# Patient Record
Sex: Female | Born: 1950 | Race: White | Hispanic: No | Marital: Married | Smoking: Never smoker
Health system: Southern US, Community
[De-identification: ages and names within clinical notes are randomized; demographics above are authoritative.]

## PROBLEM LIST (undated history)

## (undated) DIAGNOSIS — E785 Hyperlipidemia, unspecified: Secondary | ICD-10-CM

## (undated) DIAGNOSIS — M810 Age-related osteoporosis without current pathological fracture: Secondary | ICD-10-CM

## (undated) DIAGNOSIS — M1990 Unspecified osteoarthritis, unspecified site: Secondary | ICD-10-CM

## (undated) DIAGNOSIS — I10 Essential (primary) hypertension: Secondary | ICD-10-CM

## (undated) DIAGNOSIS — D649 Anemia, unspecified: Secondary | ICD-10-CM

## (undated) DIAGNOSIS — R002 Palpitations: Secondary | ICD-10-CM

## (undated) DIAGNOSIS — K8020 Calculus of gallbladder without cholecystitis without obstruction: Secondary | ICD-10-CM

## (undated) DIAGNOSIS — K219 Gastro-esophageal reflux disease without esophagitis: Secondary | ICD-10-CM

## (undated) DIAGNOSIS — Z5189 Encounter for other specified aftercare: Secondary | ICD-10-CM

## (undated) DIAGNOSIS — E079 Disorder of thyroid, unspecified: Secondary | ICD-10-CM

## (undated) DIAGNOSIS — H269 Unspecified cataract: Secondary | ICD-10-CM

## (undated) DIAGNOSIS — D689 Coagulation defect, unspecified: Secondary | ICD-10-CM

## (undated) HISTORY — DX: Unspecified cataract: H26.9

## (undated) HISTORY — DX: Calculus of gallbladder without cholecystitis without obstruction: K80.20

## (undated) HISTORY — DX: Coagulation defect, unspecified: D68.9

## (undated) HISTORY — PX: CHOLECYSTECTOMY: SHX55

## (undated) HISTORY — PX: TONSILLECTOMY: SUR1361

## (undated) HISTORY — DX: Palpitations: R00.2

## (undated) HISTORY — DX: Encounter for other specified aftercare: Z51.89

## (undated) HISTORY — PX: EAR PINNA RECONSTRUCTION W/ RIB GRAFT: SHX1484

## (undated) HISTORY — DX: Essential (primary) hypertension: I10

## (undated) HISTORY — DX: Disorder of thyroid, unspecified: E07.9

## (undated) HISTORY — DX: Hyperlipidemia, unspecified: E78.5

## (undated) HISTORY — DX: Age-related osteoporosis without current pathological fracture: M81.0

## (undated) HISTORY — DX: Unspecified osteoarthritis, unspecified site: M19.90

## (undated) HISTORY — DX: Gastro-esophageal reflux disease without esophagitis: K21.9

## (undated) HISTORY — DX: Anemia, unspecified: D64.9

---

## 2020-09-26 ENCOUNTER — Ambulatory Visit: Payer: Self-pay | Admitting: Nurse Practitioner

## 2021-01-18 ENCOUNTER — Other Ambulatory Visit: Payer: Self-pay

## 2021-01-18 ENCOUNTER — Other Ambulatory Visit: Payer: Self-pay | Admitting: Nurse Practitioner

## 2021-01-18 ENCOUNTER — Encounter: Payer: Self-pay | Admitting: Nurse Practitioner

## 2021-01-18 ENCOUNTER — Ambulatory Visit: Payer: Self-pay | Attending: Nurse Practitioner | Admitting: Nurse Practitioner

## 2021-01-18 VITALS — BP 116/75 | HR 73 | Resp 16 | Ht 62.0 in | Wt 157.4 lb

## 2021-01-18 DIAGNOSIS — Z13228 Encounter for screening for other metabolic disorders: Secondary | ICD-10-CM

## 2021-01-18 DIAGNOSIS — Z131 Encounter for screening for diabetes mellitus: Secondary | ICD-10-CM

## 2021-01-18 DIAGNOSIS — Z130 Encounter for screening for diseases of the blood and blood-forming organs and certain disorders involving the immune mechanism: Secondary | ICD-10-CM

## 2021-01-18 DIAGNOSIS — Z1329 Encounter for screening for other suspected endocrine disorder: Secondary | ICD-10-CM

## 2021-01-18 DIAGNOSIS — Z1211 Encounter for screening for malignant neoplasm of colon: Secondary | ICD-10-CM

## 2021-01-18 DIAGNOSIS — Z1159 Encounter for screening for other viral diseases: Secondary | ICD-10-CM

## 2021-01-18 DIAGNOSIS — I10 Essential (primary) hypertension: Secondary | ICD-10-CM

## 2021-01-18 DIAGNOSIS — Z2821 Immunization not carried out because of patient refusal: Secondary | ICD-10-CM

## 2021-01-18 DIAGNOSIS — E785 Hyperlipidemia, unspecified: Secondary | ICD-10-CM

## 2021-01-18 DIAGNOSIS — Z7689 Persons encountering health services in other specified circumstances: Secondary | ICD-10-CM

## 2021-01-18 MED ORDER — ENALAPRIL MALEATE 5 MG PO TABS: 5.0000 mg | ORAL_TABLET | Freq: Every day | ORAL | 1 refills | Status: DC

## 2021-01-18 NOTE — Progress Notes (Signed)
Assessment & Plan:  Annette Weiss was seen today for new patient (initial visit) and hypertension.  Diagnoses and all orders for this visit:  Encounter to establish care  Essential hypertension -     enalapril (VASOTEC) 5 MG tablet; Take 1 tablet (5 mg total) by mouth daily. Continue all antihypertensives as prescribed.  Remember to bring in your blood pressure log with you for your follow up appointment.  DASH/Mediterranean Diets are healthier choices for HTN.   Dyslipidemia, goal LDL below 100 -     Lipid panel INSTRUCTIONS: Work on a low fat, heart healthy diet and participate in regular aerobic exercise program by working out at least 150 minutes per week; 5 days a week-30 minutes per day. Avoid red meat/beef/steak,  fried foods. junk foods, sodas, sugary drinks, unhealthy snacking, alcohol and smoking.  Drink at least 80 oz of water per day and monitor your carbohydrate intake daily.   Influenza vaccine refused  COVID-19 vaccine dose declined  Tetanus, diphtheria, and acellular pertussis (Tdap) vaccination declined  Screening for deficiency anemia -     CBC  Encounter for screening for diabetes mellitus -     Hemoglobin A1c  Screening for metabolic disorder -     [id number]  Thyroid disorder screening -     Thyroid Panel With TSH  Need for hepatitis C screening test -     HCV Ab w Reflex to Quant PCR  Colon cancer screening -     Fecal occult blood, imunochemical    Patient has been counseled on age-appropriate routine health concerns for screening and prevention. These are reviewed and up-to-date. Referrals have been placed accordingly. Immunizations are up-to-date or declined.    Subjective:   Chief Complaint  Patient presents with  . New Patient (Initial Visit)  . Hypertension   HPI Annette Weiss 70 y.o. female presents to office today caompanied by her daughter who is translating.  She is a green card holder and had previously been going back and forth from  [place] but will now be residing in the [place].  She endorses a history of hypertension, hyperlipidemia and thyroid disorder but cannot recall if it is hyper or hypothyroid. Patient has been counseled on age-appropriate routine health concerns for screening and prevention. These are reviewed and up-to-date. Referrals have been placed accordingly. Immunizations are up-to-date or declined.    Mammogram: Refer to the breast clinic for a mammogram scholarship program Colonoscopy: FOBT given today   ESSENTIAL HYPERTENSION Home blood pressure readings averaging 135-140/70s.  She was previously prescribed enalapril 10 mg however she has not been taking this and has been taking a natural supplement from her country of [place].  Blood pressure is well controlled today. Denies chest pain, shortness of breath, palpitations, lightheadedness, dizziness, headaches or BLE edema.  BP Readings from Last 3 Encounters:  01/18/21 116/75    THYROID DISORDER She states that she was diagnosed with a small thyroid nodule and her thyroid levels have been abnormal in the past however she is not taking any thyroid medication.  Dyslipidemia States she has been on a statin in the past however this caused cardiac symptoms of heart palpitations so she stopped.  I did recommend that she take omega-3 over-the-counter.   COVID She was diagnosed with COVID/pneumonia in January 22 and required an inpatient hospital stay.  Post Covid symptoms she is currently experiencing: Mild shortness of breath with increased activity.   Review of Systems  Constitutional: Negative for fever, malaise/fatigue and weight  loss.  HENT: Negative for nosebleeds and sore throat.   Eyes: Negative.  Negative for blurred vision, double vision and photophobia.  Respiratory: Negative.  Negative for cough and shortness of breath (with exertion).   Cardiovascular: Negative.  Negative for chest pain, palpitations and leg swelling.   Gastrointestinal: Negative.  Negative for heartburn, nausea and vomiting.  Musculoskeletal: Negative.  Negative for myalgias.  Neurological: Negative.  Negative for dizziness, focal weakness, seizures and headaches.  Psychiatric/Behavioral: Negative.  Negative for suicidal ideas.    Past Medical History:  Diagnosis Date  . Hyperlipidemia   . Hypertension   . Thyroid disease     History reviewed. No pertinent surgical history.  History reviewed. No pertinent family history.  Social History Reviewed with no changes to be made today.   Outpatient Medications Prior to Visit  Medication Sig Dispense Refill  . aspirin EC 81 MG tablet Take 81 mg by mouth daily. Swallow whole.     No facility-administered medications prior to visit.    No Known Allergies     Objective:    BP 116/75   Pulse 73   Resp 16   Ht '5\' 2"'  (1.575 m)   Wt 157 lb 6.4 oz (71.4 kg)   SpO2 95%   BMI 28.79 kg/m  Wt Readings from Last 3 Encounters:  01/18/21 157 lb 6.4 oz (71.4 kg)    Physical Exam Vitals and nursing note reviewed.  Constitutional:      Appearance: She is well-developed and well-nourished.  HENT:     Head: Normocephalic and atraumatic.  Eyes:     Extraocular Movements: EOM normal.  Cardiovascular:     Rate and Rhythm: Normal rate and regular rhythm.     Pulses: Intact distal pulses.     Heart sounds: Normal heart sounds. No murmur heard. No friction rub. No gallop.   Pulmonary:     Effort: Pulmonary effort is normal. No tachypnea or respiratory distress.     Breath sounds: Normal breath sounds. No decreased breath sounds, wheezing, rhonchi or rales.  Chest:     Chest wall: No tenderness.  Abdominal:     General: Bowel sounds are normal.     Palpations: Abdomen is soft.  Musculoskeletal:        General: No edema. Normal range of motion.     Cervical back: Normal range of motion.  Skin:    General: Skin is warm and dry.  Neurological:     Mental Status: She is alert and  oriented to person, place, and time.     Coordination: Coordination normal.  Psychiatric:        Mood and Affect: Mood and affect normal.        Behavior: Behavior normal. Behavior is cooperative.        Thought Content: Thought content normal.        Judgment: Judgment normal.          Patient has been counseled extensively about nutrition and exercise as well as the importance of adherence with medications and regular follow-up. The patient was given clear instructions to go to ER or return to medical center if symptoms don't improve, worsen or new problems develop. The patient verbalized understanding.   Follow-up: Return for Physical ONLY no labs.   Gildardo Pounds, FNP-BC [facility] [place], [place]   01/18/2021, 9:53 AM

## 2021-01-19 LAB — LIPID PANEL
Chol/HDL Ratio: 3.9 ratio (ref 0.0–4.4)
Cholesterol, Total: 270 mg/dL — ABNORMAL HIGH (ref 100–199)
HDL: 70 mg/dL (ref >39)
LDL Chol Calc (NIH): 181 mg/dL — ABNORMAL HIGH (ref 0–99)
Triglycerides: 108 mg/dL (ref 0–149)
VLDL Cholesterol Cal: 19 mg/dL (ref 5–40)

## 2021-01-19 LAB — HCV AB W REFLEX TO QUANT PCR: HCV Ab: <0.1 s/co ratio (ref 0.0–0.9)

## 2021-01-19 LAB — CBC
Hematocrit: 38.1 % (ref 34.0–46.6)
Hemoglobin: 12.4 g/dL (ref 11.1–15.9)
MCH: 29.2 pg (ref 26.6–33.0)
MCHC: 32.5 g/dL (ref 31.5–35.7)
MCV: 90 fL (ref 79–97)
Platelets: 189 10*3/uL (ref 150–450)
RBC: 4.25 x10E6/uL (ref 3.77–5.28)
RDW: 12.7 % (ref 11.7–15.4)
WBC: 5.8 10*3/uL (ref 3.4–10.8)

## 2021-01-19 LAB — CMP14+EGFR
ALT: 22 IU/L (ref 0–32)
AST: 18 IU/L (ref 0–40)
Albumin/Globulin Ratio: 1.3 (ref 1.2–2.2)
Albumin: 4.2 g/dL (ref 3.7–4.7)
Alkaline Phosphatase: 85 IU/L (ref 44–121)
BUN/Creatinine Ratio: 22 (ref 12–28)
BUN: 14 mg/dL (ref 8–27)
Bilirubin Total: 0.3 mg/dL (ref 0.0–1.2)
CO2: 20 mmol/L (ref 20–29)
Calcium: 9.5 mg/dL (ref 8.7–10.3)
Chloride: 106 mmol/L (ref 96–106)
Creatinine, Ser: 0.65 mg/dL (ref 0.57–1.00)
Globulin, Total: 3.3 g/dL (ref 1.5–4.5)
Glucose: 114 mg/dL — ABNORMAL HIGH (ref 65–99)
Potassium: 4.2 mmol/L (ref 3.5–5.2)
Sodium: 143 mmol/L (ref 134–144)
Total Protein: 7.5 g/dL (ref 6.0–8.5)
eGFR: 93 mL/min/{1.73_m2} (ref >59)

## 2021-01-19 LAB — THYROID PANEL WITH TSH
Free Thyroxine Index: 1.5 (ref 1.2–4.9)
T3 Uptake Ratio: 27 % (ref 24–39)
T4, Total: 5.7 ug/dL (ref 4.5–12.0)
TSH: 1.25 u[IU]/mL (ref 0.450–4.500)

## 2021-01-19 LAB — HEMOGLOBIN A1C
Est. average glucose Bld gHb Est-mCnc: 108 mg/dL
Hgb A1c MFr Bld: 5.4 % (ref 4.8–5.6)

## 2021-01-19 LAB — HCV INTERPRETATION

## 2021-01-21 LAB — FECAL OCCULT BLOOD, IMMUNOCHEMICAL: Fecal Occult Bld: NEGATIVE

## 2021-01-25 ENCOUNTER — Telehealth: Payer: Self-pay | Admitting: Nurse Practitioner

## 2021-01-25 NOTE — Telephone Encounter (Signed)
Copied from CRM [id number]. Topic: General - Other >> Jan 25, 2021 10:27 AM Wyonia Hough E wrote: Reason for CRM: Pt wanted information on low cost dentist / pt has tooth pain and no insurance please advise asap/ please advise

## 2021-01-25 NOTE — Telephone Encounter (Signed)
Pt will be mailed dental listing

## 2021-02-20 ENCOUNTER — Other Ambulatory Visit: Payer: Self-pay

## 2021-02-20 ENCOUNTER — Ambulatory Visit: Payer: Self-pay | Attending: Nurse Practitioner

## 2021-03-13 ENCOUNTER — Other Ambulatory Visit: Payer: Self-pay | Admitting: Nurse Practitioner

## 2021-03-13 ENCOUNTER — Telehealth: Payer: Self-pay | Admitting: Nurse Practitioner

## 2021-03-13 DIAGNOSIS — K0889 Other specified disorders of teeth and supporting structures: Secondary | ICD-10-CM

## 2021-03-13 NOTE — Telephone Encounter (Signed)
Referral placed.

## 2021-03-13 NOTE — Telephone Encounter (Signed)
Will forward to provider  

## 2021-03-13 NOTE — Telephone Encounter (Signed)
Patient requesting referral to dentist

## 2021-03-13 NOTE — Telephone Encounter (Signed)
Patient called to ask if she could get a referral to the dentist listed on her orange card ([facility]) because she has a bad pain on her right side bottom tooth.  She stated the dentist told her she would need a referral.  Please advise and cal patient to discuss at [phone]

## 2021-03-16 ENCOUNTER — Other Ambulatory Visit: Payer: Self-pay

## 2021-03-16 MED ORDER — CLINDAMYCIN HCL 300 MG PO CAPS
300.0000 mg | ORAL_CAPSULE | Freq: Four times a day (QID) | ORAL | 0 refills | Status: DC
  Filled 2021-03-16: qty 40, 10d supply, fill #0

## 2021-03-20 ENCOUNTER — Other Ambulatory Visit: Payer: Self-pay

## 2021-05-17 ENCOUNTER — Ambulatory Visit: Payer: Self-pay | Admitting: Physician Assistant

## 2021-05-30 ENCOUNTER — Other Ambulatory Visit: Payer: Self-pay

## 2021-07-05 ENCOUNTER — Ambulatory Visit: Payer: Self-pay

## 2021-07-05 NOTE — Telephone Encounter (Signed)
Pt. Reports she has felt bad x 3 weeks "and it's getting worse.Headache,dizziness and I feel weak." BP today at home is 169/98. Refuses UC/ED.The practice is currently closed for lunch. Pt. Would like an appointment as soon as possible. Please advise.    Reason for Disposition  [1] Taking BP medications AND [2] feels is having side effects (e.g., impotence, cough, dizzy upon standing)  Answer Assessment - Initial Assessment Questions 1. BLOOD PRESSURE: "What is the blood pressure?" "Did you take at least two measurements 5 minutes apart?"     169/98 2. ONSET: "When did you take your blood pressure?"     3 weeks 3. HOW: "How did you obtain the blood pressure?" (e.g., visiting nurse, automatic home BP monitor)     Home cuff 4. HISTORY: "Do you have a history of high blood pressure?"     No 5. MEDICATIONS: "Are you taking any medications for blood pressure?" "Have you missed any doses recently?"     No 6. OTHER SYMPTOMS: "Do you have any symptoms?" (e.g., headache, chest pain, blurred vision, difficulty breathing, weakness)     Dizziness, weak, headache 7. PREGNANCY: "Is there any chance you are pregnant?" "When was your last menstrual period?"     no  Protocols used: Blood Pressure - High-A-AH

## 2021-07-06 NOTE — Telephone Encounter (Signed)
Please schedule patient an appointment with a provider.

## 2021-07-17 NOTE — Telephone Encounter (Signed)
Patient's daughter is calling- BP is still high- today 186/95. Dizzy, weak and fatigued. [facility] and they told her they could not treat her- no doctor on staff. Patient is taking the prescribed medication - but not controlling the BP all the time. BP goes up at night. They are upset they have not heard back from the office- call to office today- only available appointment is 9/7, 3:10 which they were scheduled for. Patient's daughter is hesitant to go to ED- she does not know if they will accept the [facility] card- for patient care. Patient will not go to UC.  Advised UC/ED for BP and symptoms- unsure if they will go.

## 2021-07-18 ENCOUNTER — Ambulatory Visit: Payer: Self-pay | Admitting: Physician Assistant

## 2021-07-18 ENCOUNTER — Other Ambulatory Visit: Payer: Self-pay

## 2021-07-18 VITALS — BP 129/84 | HR 67 | Temp 98.2°F | Resp 18 | Ht 59.06 in | Wt 150.0 lb

## 2021-07-18 DIAGNOSIS — I10 Essential (primary) hypertension: Secondary | ICD-10-CM

## 2021-07-18 NOTE — Patient Instructions (Addendum)
Please continue taking your blood pressure medication on a daily basis, keep a written log and have available for your appt next week at [facility].  I have included information on hypotensive symptoms so you can be aware  Roney Jaffe, PA-C Physician Assistant [facility] [email or web address]   Hypotension As your heart beats, it forces blood through your body. Hypotension, commonly called low blood pressure, is when the force of blood pumping through your arteries is too weak. Arteries are blood vessels that carry blood from the heart throughout the body. Depending on the cause and severity, hypotension may be harmless (benign) or may cause serious problems (be critical). When blood pressure is too low, you may not get enough blood to your brain or to the rest of your organs. This can cause weakness, light-headedness, rapidheartbeat, and fainting. What are the causes? This condition may be caused by: Blood loss. Loss of body fluids (dehydration). Heart problems. Hormone (endocrine) problems. Pregnancy. Severe infection. Lack of certain nutrients. Severe allergic reactions (anaphylaxis). Certain medicines, such as blood pressure medicine or medicines that make the body lose excess fluids (diuretics). Sometimes, hypotension may be caused by not taking medicine as directed, such as taking too much of a certain medicine. What increases the risk? The following factors may make you more likely to develop this condition: Age. Risk increases as you get older. Conditions that affect the heart or the central nervous system. Taking certain medicines, such as blood pressure medicine or diuretics. Being pregnant. What are the signs or symptoms? Common symptoms of this condition include: Weakness. Light-headedness. Dizziness. Blurred vision. Fatigue. Rapid heartbeat. Fainting, in severe cases. How is this  diagnosed? This condition is diagnosed based on: Your medical history. Your symptoms. Your blood pressure measurement. Your health care provider will check your blood pressure when you are: Lying down. Sitting. Standing. A blood pressure reading is recorded as two numbers, such as "120 over 80" (or 120/80). The first ("top") number is called the systolic pressure. It is a measure of the pressure in your arteries as your heart beats. The second ("bottom") number is called the diastolic pressure. It is a measure of the pressure in your arteries when your heart relaxes between beats. Blood pressure is measured in a unit called mm Hg. Healthy blood pressure for most adults is 120/80. If your blood pressure is below 90/60, you may be diagnosed withhypotension. Other information or tests that may be used to diagnose hypotension include: Your other vital signs, such as your heart rate and temperature. Blood tests. Tilt table test. For this test, you will be safely secured to a table that moves you from a lying position to an upright position. Your heart rhythm and blood pressure will be monitored during the test. How is this treated? Treatment for this condition may include: Changing your diet. This may involve eating more salt (sodium) or drinking more water. Taking medicines to raise your blood pressure. Changing the dosage of certain medicines you are taking that might be lowering your blood pressure. Wearing compression stockings. These stockings help to prevent blood clots and reduce swelling in your legs. In some cases, you may need to go to the hospital for: Fluid replacement. This means you will receive fluids through an IV. Blood replacement. This means you will receive donated blood through an IV (transfusion). Treating an infection or heart problems, if this applies. Monitoring. You may need to be monitored while medicines that you are taking wear  off. Follow these instructions at  home: Eating and drinking  Drink enough fluid to keep your urine pale yellow. Eat a healthy diet, and follow instructions from your health care provider about eating or drinking restrictions. A healthy diet includes: Fresh fruits and vegetables. Whole grains. Lean meats. Low-fat dairy products. Eat extra salt only as directed. Do not add extra salt to your diet unless your health care provider told you to do that. Eat frequent, small meals. Avoid standing up suddenly after eating.  Medicines Take over-the-counter and prescription medicines only as told by your health care provider. Follow instructions from your health care provider about changing the dosage of your current medicines, if this applies. Do not stop or adjust any of your medicines on your own. General instructions  Wear compression stockings as told by your health care provider. Get up slowly from lying down or sitting positions. This gives your blood pressure a chance to adjust. Avoid hot showers and excessive heat as directed by your health care provider. Return to your normal activities as told by your health care provider. Ask your health care provider what activities are safe for you. Do not use any products that contain nicotine or tobacco, such as cigarettes, e-cigarettes, and chewing tobacco. If you need help quitting, ask your health care provider. Keep all follow-up visits as told by your health care provider. This is important.  Contact a health care provider if you: Vomit. Have diarrhea. Have a fever for more than 2-3 days. Feel more thirsty than usual. Feel weak and tired. Get help right away if you: Have chest pain. Have a fast or irregular heartbeat. Develop numbness in any part of your body. Cannot move your arms or your legs. Have trouble speaking. Become sweaty or feel light-headed. Faint. Feel short of breath. Have trouble staying awake. Feel confused. Summary Hypotension is when the force  of blood pumping through your arteries is too weak. Hypotension may be harmless (benign) or may cause serious problems (be critical). Treatment for this condition may include changing your diet, changing your medicines, and wearing compression stockings. In some cases, you may need to go to the hospital for fluid or blood replacement. This information is not intended to replace advice given to you by your health care provider. Make sure you discuss any questions you have with your healthcare provider. Document Revised: 05/01/2018 Document Reviewed: 05/01/2018 Elsevier Patient Education  2022 [employer].

## 2021-07-18 NOTE — Telephone Encounter (Signed)
Spoke to patient daughter. Advised [facility] to have patient evaluation. Explained that  a MD is not on the bus but a Advice worker is. Lab test that she see fit for treatment is available to be performed on the bus. This is the soonest non- appointment we can offer mother.   Given address to locations for today and Wednesday. Verbalized understanding.

## 2021-07-18 NOTE — Progress Notes (Signed)
Patient is taking BP medication and reports increase in BP at night. Patient has eaten today and patient has taken medication today. Patient denies pain at this time. Patient was not taking BP medication regularly. On days the BP was normal range she would not take the medication. Patient has been taking regularly for the past 3 days and repots normal BP.

## 2021-07-18 NOTE — Progress Notes (Signed)
Established Patient Office Visit  Subjective:  Patient ID: Annette Weiss, female    DOB: 03-29-1951  Age: 70 y.o. MRN: [id number]  CC:  Chief Complaint  Patient presents with   Hypertension    HPI Unita Detamore states that she has been checking her BP at home and has been having elevated BP readings, states that she has not been taking the medication on the days that her BP was WNL, but has noticed that she has been having elevated blood pressure readings at night.  Two weeks ago her BP was higher at night (170/85)so she restarted the medication and states that she has been taking it every day for the past three days and it has been WNL.  States that when she was taking it on a daily basis she was seeing readings of 110/70 and was concerned.  Denied hypotensive symptoms at that time.   Due to language barrier, an interpreter was present during the history-taking and subsequent discussion (and for part of the physical exam) with this patient.       Past Medical History:  Diagnosis Date   Hyperlipidemia    Hypertension    Thyroid disease     History reviewed. No pertinent surgical history.  History reviewed. No pertinent family history.  Social History   Socioeconomic History   Marital status: Married    Spouse name: Not on file   Number of children: Not on file   Years of education: Not on file   Highest education level: Not on file  Occupational History   Not on file  Tobacco Use   Smoking status: Never   Smokeless tobacco: Never  Vaping Use   Vaping Use: Never used  Substance and Sexual Activity   Alcohol use: Never   Drug use: Never   Sexual activity: Yes  Other Topics Concern   Not on file  Social History Narrative   Not on file   Social Determinants of Health   Financial Resource Strain: Not on file  Food Insecurity: Not on file  Transportation Needs: Not on file  Physical Activity: Not on file  Stress: Not on file  Social Connections: Not on  file  Intimate Partner Violence: Not on file    Outpatient Medications Prior to Visit  Medication Sig Dispense Refill   enalapril (VASOTEC) 5 MG tablet TAKE 1 TABLET (5 MG TOTAL) BY MOUTH DAILY. 90 tablet 1   enalapril (VASOTEC) 5 MG tablet Take 1 tablet (5 mg total) by mouth daily. 90 tablet 1   aspirin EC 81 MG tablet Take 81 mg by mouth daily. Swallow whole. (Patient not taking: Reported on 07/18/2021)     clindamycin (CLEOCIN) 300 MG capsule Take 1 capsule (300 mg total) by mouth every 6 (six) hours. until gone 40 capsule 0   No facility-administered medications prior to visit.    No Known Allergies  ROS Review of Systems  Constitutional: Negative.   HENT: Negative.    Eyes: Negative.   Respiratory:  Negative for shortness of breath.   Cardiovascular:  Negative for chest pain.  Gastrointestinal: Negative.   Endocrine: Negative.   Genitourinary: Negative.   Musculoskeletal: Negative.   Allergic/Immunologic: Negative.   Neurological: Negative.   Hematological: Negative.   Psychiatric/Behavioral: Negative.       Objective:    Physical Exam Vitals and nursing note reviewed.  Constitutional:      Appearance: Normal appearance.  HENT:     Head: Normocephalic and atraumatic.  Right Ear: External ear normal.     Left Ear: External ear normal.     Nose: Nose normal.     Mouth/Throat:     Mouth: Mucous membranes are moist.     Pharynx: Oropharynx is clear.  Eyes:     Extraocular Movements: Extraocular movements intact.     Conjunctiva/sclera: Conjunctivae normal.     Pupils: Pupils are equal, round, and reactive to light.  Cardiovascular:     Rate and Rhythm: Normal rate and regular rhythm.     Pulses: Normal pulses.     Heart sounds: Normal heart sounds.  Pulmonary:     Effort: Pulmonary effort is normal.     Breath sounds: Normal breath sounds.  Musculoskeletal:        General: Normal range of motion.     Cervical back: Normal range of motion and neck supple.   Skin:    General: Skin is warm and dry.  Neurological:     General: No focal deficit present.     Mental Status: She is alert and oriented to person, place, and time.  Psychiatric:        Mood and Affect: Mood normal.        Behavior: Behavior normal.        Thought Content: Thought content normal.        Judgment: Judgment normal.    BP 129/84 (BP Location: Left Arm, Patient Position: Sitting, Cuff Size: Large)   Pulse 67   Temp 98.2 F (36.8 C) (Oral)   Resp 18   Ht 4' 11.06" (1.5 m)   Wt 150 lb (68 kg)   SpO2 98%   BMI 30.24 kg/m  Wt Readings from Last 3 Encounters:  07/18/21 150 lb (68 kg)  01/18/21 157 lb 6.4 oz (71.4 kg)     Health Maintenance Due  Topic Date Due   COVID-19 Vaccine (1) Never done   COLONOSCOPY (Pts 45-56yr Insurance coverage will need to be confirmed)  Never done   MAMMOGRAM  Never done   Zoster Vaccines- Shingrix (1 of 2) Never done   DEXA SCAN  Never done   PNA vac Low Risk Adult (1 of 2 - PCV13) Never done   INFLUENZA VACCINE  Never done    There are no preventive care reminders to display for this patient.  Lab Results  Component Value Date   TSH 1.250 01/18/2021   Lab Results  Component Value Date   WBC 5.8 01/18/2021   HGB 12.4 01/18/2021   HCT 38.1 01/18/2021   MCV 90 01/18/2021   PLT 189 01/18/2021   Lab Results  Component Value Date   NA 143 01/18/2021   K 4.2 01/18/2021   CO2 20 01/18/2021   GLUCOSE 114 (H) 01/18/2021   BUN 14 01/18/2021   CREATININE 0.65 01/18/2021   BILITOT 0.3 01/18/2021   ALKPHOS 85 01/18/2021   AST 18 01/18/2021   ALT 22 01/18/2021   PROT 7.5 01/18/2021   ALBUMIN 4.2 01/18/2021   CALCIUM 9.5 01/18/2021   EGFR 93 01/18/2021   Lab Results  Component Value Date   CHOL 270 (H) 01/18/2021   Lab Results  Component Value Date   HDL 70 01/18/2021   Lab Results  Component Value Date   LDLCALC 181 (H) 01/18/2021   Lab Results  Component Value Date   TRIG 108 01/18/2021   Lab Results   Component Value Date   CHOLHDL 3.9 01/18/2021   Lab Results  Component  Value Date   HGBA1C 5.4 01/18/2021      Assessment & Plan:   Problem List Items Addressed This Visit       Cardiovascular and Mediastinum   Essential hypertension - Primary    No orders of the defined types were placed in this encounter. 1. Essential hypertension Encourage patient to continue prescribed medication, check blood pressure on a daily basis, keep a written log and have available for all office visits.  Patient education given on signs of hypotension.  Patient has upcoming appointment with primary care on July 26, 2021.  Red flags given for prompt reevaluation.  Patient understands and agrees.   I have reviewed the patient's medical history (PMH, PSH, Social History, Family History, Medications, and allergies) , and have been updated if relevant. I spent 30 minutes reviewing chart and  face to face time with patient.    Follow-up: Return in about 8 days (around 07/26/2021) for [facility].    Loraine Grip Mayers, PA-C

## 2021-07-21 DIAGNOSIS — I10 Essential (primary) hypertension: Secondary | ICD-10-CM | POA: Insufficient documentation

## 2021-07-26 ENCOUNTER — Ambulatory Visit: Payer: Self-pay | Admitting: Physician Assistant

## 2021-12-28 ENCOUNTER — Other Ambulatory Visit: Payer: Self-pay

## 2021-12-28 ENCOUNTER — Ambulatory Visit: Payer: Self-pay | Attending: Physician Assistant | Admitting: Physician Assistant

## 2021-12-28 ENCOUNTER — Encounter: Payer: Self-pay | Admitting: Physician Assistant

## 2021-12-28 VITALS — BP 146/85 | HR 66 | Ht 59.0 in | Wt 148.0 lb

## 2021-12-28 DIAGNOSIS — Z789 Other specified health status: Secondary | ICD-10-CM

## 2021-12-28 DIAGNOSIS — K219 Gastro-esophageal reflux disease without esophagitis: Secondary | ICD-10-CM

## 2021-12-28 DIAGNOSIS — I10 Essential (primary) hypertension: Secondary | ICD-10-CM

## 2021-12-28 MED ORDER — AMLODIPINE BESYLATE 5 MG PO TABS
5.0000 mg | ORAL_TABLET | Freq: Every day | ORAL | 1 refills | Status: DC
  Filled 2021-12-28: qty 30, 30d supply, fill #0

## 2021-12-28 MED ORDER — OMEPRAZOLE 20 MG PO CPDR
20.0000 mg | DELAYED_RELEASE_CAPSULE | Freq: Every day | ORAL | 1 refills | Status: DC
  Filled 2021-12-28: qty 30, 30d supply, fill #0

## 2021-12-28 NOTE — Progress Notes (Signed)
Patient ID: Annette Weiss, female   DOB: [birth year], 71 y.o.   MRN: [id number]   Annette Weiss, is a 71 y.o. female  QQV:[id number]  PPI:[id number]  DOB - 17-Apr-1951  Chief Complaint  Patient presents with   Referral   Gastroesophageal Reflux       Subjective:   Annette Weiss is a 71 y.o. female here today for a medication and Rx omeprazole.  She and her husband have been in [place] the last few months for him to have triple bypass there bc it was much cheaper than doing it in the [place].  She changed BP meds from enalapril to amlodipine 5mg  while there.  She is doing well on it.  She checks her blood pressure 2 times daily and it is usu 110-120/70-80 when she takes her meds  Also she was prescribed omeprazole for acid reflux and has run out of it.    Patient has No headache, No chest pain, No abdominal pain - No Nausea, No new weakness tingling or numbness, No Cough - SOB.  No problems updated.  ALLERGIES: No Known Allergies  PAST MEDICAL HISTORY: Past Medical History:  Diagnosis Date   Hyperlipidemia    Hypertension    Thyroid disease     MEDICATIONS AT HOME: Prior to Admission medications   Medication Sig Start Date End Date Taking? Authorizing Provider  omeprazole (PRILOSEC) 20 MG capsule Take 1 capsule (20 mg total) by mouth daily. 12/28/21  Yes 02/25/22 M, PA-C  amLODipine (NORVASC) 5 MG tablet Take 1 tablet (5 mg total) by mouth daily. 12/28/21   Ashlea Dusing, 02/25/22, PA-C    ROS: Neg HEENT Neg resp Neg cardiac Neg GI Neg GU Neg MS Neg psych Neg neuro  Objective:   Vitals:   12/28/21 1509  BP: (!) 146/85  Pulse: 66  SpO2: 97%  Weight: 148 lb (67.1 kg)  Height: 4\' 11"  (1.499 m)   Exam General appearance : Awake, alert, not in any distress. Speech Clear. Not toxic looking HEENT: Atraumatic and Normocephalic, Chest: Good air entry bilaterally, CTAB.  No rales/rhonchi/wheezing CVS: S1 S2 regular, no murmurs.  Extremities: B/L Lower Ext shows no edema, both  legs are warm to touch Neurology: Awake alert, and oriented X 3, CN II-XII intact, Non focal Skin: No Rash  Data Review Lab Results  Component Value Date   HGBA1C 5.4 01/18/2021    Assessment & Plan   1. Essential hypertension Controlled on meds based on home readings - Comprehensive metabolic panel - amLODipine (NORVASC) 5 MG tablet; Take 1 tablet (5 mg total) by mouth daily.  Dispense: 90 tablet; Refill: 1  2. Gastroesophageal reflux disease without esophagitis - CBC with Differential/Platelet - omeprazole (PRILOSEC) 20 MG capsule; Take 1 capsule (20 mg total) by mouth daily.  Dispense: 90 capsule; Refill: 1  3. Language barrier AMN interpreters used (Margarita)and additional time performing visit was required.   Patient have been counseled extensively about nutrition and exercise. Other issues discussed during this visit include: low cholesterol diet, weight control and daily exercise, foot care, annual eye examinations at Ophthalmology, importance of adherence with medications and regular follow-up. We also discussed long term complications of uncontrolled diabetes and hypertension.   Return in about 6 months (around 06/27/2022) for chronic conditions with PCP.  The patient was given clear instructions to go to ER or return to medical center if symptoms don't improve, worsen or new problems develop. The patient verbalized understanding. The patient was told to call to get  lab results if they haven't heard anything in the next week.      Georgian Co, PA-C [facility] [place], [place] [phone]   12/28/2021, 4:17 PM

## 2021-12-28 NOTE — Progress Notes (Signed)
Davonna Belling [phone]

## 2021-12-29 ENCOUNTER — Other Ambulatory Visit: Payer: Self-pay

## 2021-12-29 LAB — COMPREHENSIVE METABOLIC PANEL
ALT: 11 IU/L (ref 0–32)
AST: 16 IU/L (ref 0–40)
Albumin/Globulin Ratio: 1.4 (ref 1.2–2.2)
Albumin: 4.4 g/dL (ref 3.7–4.7)
Alkaline Phosphatase: 82 IU/L (ref 44–121)
BUN/Creatinine Ratio: 24 (ref 12–28)
BUN: 15 mg/dL (ref 8–27)
Bilirubin Total: 0.2 mg/dL (ref 0.0–1.2)
CO2: 24 mmol/L (ref 20–29)
Calcium: 9.6 mg/dL (ref 8.7–10.3)
Chloride: 106 mmol/L (ref 96–106)
Creatinine, Ser: 0.63 mg/dL (ref 0.57–1.00)
Globulin, Total: 3.1 g/dL (ref 1.5–4.5)
Glucose: 91 mg/dL (ref 70–99)
Potassium: 4.7 mmol/L (ref 3.5–5.2)
Sodium: 143 mmol/L (ref 134–144)
Total Protein: 7.5 g/dL (ref 6.0–8.5)
eGFR: 94 mL/min/{1.73_m2} (ref >59)

## 2021-12-29 LAB — CBC WITH DIFFERENTIAL/PLATELET
Basophils Absolute: 0.1 10*3/uL (ref 0.0–0.2)
Basos: 1 %
EOS (ABSOLUTE): 0.3 10*3/uL (ref 0.0–0.4)
Eos: 5 %
Hematocrit: 40.6 % (ref 34.0–46.6)
Hemoglobin: 13.1 g/dL (ref 11.1–15.9)
Immature Grans (Abs): 0 10*3/uL (ref 0.0–0.1)
Immature Granulocytes: 0 %
Lymphocytes Absolute: 2.4 10*3/uL (ref 0.7–3.1)
Lymphs: 42 %
MCH: 28.5 pg (ref 26.6–33.0)
MCHC: 32.3 g/dL (ref 31.5–35.7)
MCV: 88 fL (ref 79–97)
Monocytes Absolute: 0.4 10*3/uL (ref 0.1–0.9)
Monocytes: 7 %
Neutrophils Absolute: 2.6 10*3/uL (ref 1.4–7.0)
Neutrophils: 45 %
Platelets: 184 10*3/uL (ref 150–450)
RBC: 4.6 x10E6/uL (ref 3.77–5.28)
RDW: 11.8 % (ref 11.7–15.4)
WBC: 5.7 10*3/uL (ref 3.4–10.8)

## 2022-01-05 ENCOUNTER — Encounter: Payer: Self-pay | Admitting: *Deleted

## 2022-01-05 ENCOUNTER — Telehealth: Payer: Self-pay | Admitting: *Deleted

## 2022-01-05 NOTE — Telephone Encounter (Signed)
Daughter requesting to speak to [place].  Listed on [facility]

## 2022-01-08 NOTE — Telephone Encounter (Signed)
I return call, LVM to call me back

## 2022-01-16 ENCOUNTER — Ambulatory Visit: Payer: Self-pay | Attending: Nurse Practitioner

## 2022-01-16 ENCOUNTER — Other Ambulatory Visit: Payer: Self-pay

## 2022-02-05 ENCOUNTER — Other Ambulatory Visit: Payer: Self-pay

## 2022-02-05 ENCOUNTER — Ambulatory Visit: Payer: Self-pay

## 2022-02-05 ENCOUNTER — Ambulatory Visit: Payer: Self-pay | Attending: Nurse Practitioner | Admitting: Nurse Practitioner

## 2022-02-05 ENCOUNTER — Encounter: Payer: Self-pay | Admitting: Nurse Practitioner

## 2022-02-05 VITALS — BP 121/77 | HR 60 | Resp 18 | Ht 59.0 in | Wt 145.4 lb

## 2022-02-05 DIAGNOSIS — I10 Essential (primary) hypertension: Secondary | ICD-10-CM

## 2022-02-05 DIAGNOSIS — Z1211 Encounter for screening for malignant neoplasm of colon: Secondary | ICD-10-CM

## 2022-02-05 DIAGNOSIS — R109 Unspecified abdominal pain: Secondary | ICD-10-CM

## 2022-02-05 DIAGNOSIS — K219 Gastro-esophageal reflux disease without esophagitis: Secondary | ICD-10-CM

## 2022-02-05 DIAGNOSIS — R002 Palpitations: Secondary | ICD-10-CM

## 2022-02-05 MED ORDER — ENALAPRIL MALEATE 5 MG PO TABS
5.0000 mg | ORAL_TABLET | Freq: Every day | ORAL | 0 refills | Status: DC
  Filled 2022-02-05: qty 30, 30d supply, fill #0

## 2022-02-05 MED ORDER — OMEPRAZOLE 20 MG PO CPDR
20.0000 mg | DELAYED_RELEASE_CAPSULE | Freq: Every day | ORAL | 1 refills | Status: DC
  Filled 2022-02-05: qty 90, 90d supply, fill #0

## 2022-02-05 NOTE — Telephone Encounter (Signed)
?  Chief Complaint: HTN ?Symptoms: Every night  ?Frequency: 15 years. ?Pertinent Negatives: Patient denies Pt is having HA, and dizziness ?Disposition: [] ED /[] Urgent Care (no appt availability in office) / [x] Appointment(In office/virtual)/ []  [facility] [facility]/ [] Home Care/ [] Refused Recommended Disposition /[] [facility]/ []  Follow-up with PCP ?Additional Notes: Pt had beentaking her husband's HTN medication. She is now out of the medication and nightly BP's are in the 160's over 90's. Pt is also having GERD symptoms. ? ? ? ? ?Reason for Disposition ? Systolic BP  >= 160 OR Diastolic >= 100 ? ?Answer Assessment - Initial Assessment Questions ?1. BLOOD PRESSURE: "What is the blood pressure?" "Did you take at least two measurements 5 minutes apart?" ?    160/90 ?2. ONSET: "When did you take your blood pressure?" ?    Last night ?3. HOW: "How did you obtain the blood pressure?" (e.g., visiting nurse, automatic home BP monitor) ?    automatic ?4. HISTORY: "Do you have a history of high blood pressure?" ?    Many years ?5. MEDICATIONS: "Are you taking any medications for blood pressure?" "Have you missed any doses recently?" ?    Yes -  ?6. OTHER SYMPTOMS: "Do you have any symptoms?" (e.g., headache, chest pain, blurred vision, difficulty breathing, weakness) ?    Heart pound ?7. PREGNANCY: "Is there any chance you are pregnant?" "When was your last menstrual period?" ?    na ? ?Protocols used: Blood Pressure - High-A-AH ? ?

## 2022-02-05 NOTE — Telephone Encounter (Signed)
Patient has apt with PCP today at 1510.  ? ?

## 2022-02-05 NOTE — Progress Notes (Signed)
? ?Assessment & Plan:  ?[name] was seen today for hypertension. ? ?Diagnoses and all orders for this visit: ? ?Essential hypertension ?-     enalapril (VASOTEC) 5 MG tablet; Take 1 tablet (5 mg total) by mouth daily. ? ?Heart palpitations ?-     Ambulatory referral to Cardiology ? ?Left flank pain ?-     Urinalysis, Complete ? ?Gastroesophageal reflux disease without esophagitis ?-     omeprazole (PRILOSEC) 20 MG capsule; Take 1 capsule (20 mg total) by mouth daily. ? ?Colon cancer screening ?-     Fecal occult blood, imunochemical ? ? ? ?Patient has been counseled on age-appropriate routine health concerns for screening and prevention. These are reviewed and up-to-date. Referrals have been placed accordingly. Immunizations are up-to-date or declined.    ?Subjective:  ? ?Chief Complaint  ?Patient presents with  ? Hypertension  ? ?HPI ?Annette Weiss 71 y.o. female presents to office today accompanied by her daughter who is helping to translate.  ?She has a past medical history of Hyperlipidemia, Hypertension, and Thyroid disease.  ? ? ?She reports high blood pressure readings along with palpitations that wake her up in the middle of the night around 2 am. Reports BP 150-160/80-90s. She was told to take 25 mg of captopril "as needed" in the middle of the night when she has these episodes. Reports being given these instructions by a primary care in her [place].  She also takes amlodipine 5mg  daily but would like to start back on enalapril which she was taking previously. I will restart her enalapril however she was also instructed to not take captopril in the middle of the night anymore as it is not intended to be taken as needed. States after she takes the captopril her blood pressure normalizes in about 30 minutes or so to 130/80s.  ? ?As blood pressure is well controlled today I do not want to make any further adjustments in her medications except for switching amlodipine to enalapril today. She will need to see  Cardiology for possible event monitor.  ? ?Flank Pain ?She endorses left flank pain. Denies hematuria, pelvic pressure or dysuria.  ? ?Review of Systems  ?Constitutional:  Negative for fever, malaise/fatigue and weight loss.  ?HENT: Negative.  Negative for nosebleeds.   ?Eyes: Negative.  Negative for blurred vision, double vision and photophobia.  ?Respiratory: Negative.  Negative for cough and shortness of breath.   ?Cardiovascular: Negative.  Negative for chest pain, palpitations and leg swelling.  ?Gastrointestinal:  Positive for heartburn. Negative for nausea and vomiting.  ?Genitourinary:  Positive for flank pain. Negative for dysuria, frequency, hematuria and urgency.  ?Musculoskeletal:  Negative for myalgias.  ?Neurological: Negative.  Negative for dizziness, focal weakness, seizures and headaches.  ?Psychiatric/Behavioral: Negative.  Negative for suicidal ideas.   ? ?Past Medical History:  ?Diagnosis Date  ? Hyperlipidemia   ? Hypertension   ? Thyroid disease   ? ? ?No past surgical history on file. ? ?No family history on file. ? ?Social History Reviewed with no changes to be made today.  ? ?Outpatient Medications Prior to Visit  ?Medication Sig Dispense Refill  ? amLODipine (NORVASC) 5 MG tablet Take 1 tablet (5 mg total) by mouth daily. 90 tablet 1  ? omeprazole (PRILOSEC) 20 MG capsule Take 1 capsule (20 mg total) by mouth daily. 90 capsule 1  ? ?No facility-administered medications prior to visit.  ? ? ?No Known Allergies ? ?   ?Objective:  ?  ?BP 121/77  Pulse 60   Resp 18   Ht 4\' 11"  (1.499 m)   Wt 145 lb 6 oz (65.9 kg)   SpO2 98%   BMI 29.36 kg/m?  ?Wt Readings from Last 3 Encounters:  ?02/05/22 145 lb 6 oz (65.9 kg)  ?12/28/21 148 lb (67.1 kg)  ?07/18/21 150 lb (68 kg)  ? ? ?Physical Exam ?Vitals and nursing note reviewed.  ?Constitutional:   ?   Appearance: She is well-developed.  ?HENT:  ?   Head: Normocephalic and atraumatic.  ?Cardiovascular:  ?   Rate and Rhythm: Normal rate and regular  rhythm.  ?   Heart sounds: Normal heart sounds. No murmur heard. ?  No friction rub. No gallop.  ?Pulmonary:  ?   Effort: Pulmonary effort is normal. No tachypnea or respiratory distress.  ?   Breath sounds: Normal breath sounds. No decreased breath sounds, wheezing, rhonchi or rales.  ?Chest:  ?   Chest wall: No tenderness.  ?Abdominal:  ?   General: Bowel sounds are normal.  ?   Palpations: Abdomen is soft.  ?   Tenderness: There is no right CVA tenderness or left CVA tenderness.  ?Musculoskeletal:     ?   General: Normal range of motion.  ?   Cervical back: Normal range of motion.  ?Skin: ?   General: Skin is warm and dry.  ?Neurological:  ?   Mental Status: She is alert and oriented to person, place, and time.  ?   Coordination: Coordination normal.  ?Psychiatric:     ?   Behavior: Behavior normal. Behavior is cooperative.     ?   Thought Content: Thought content normal.     ?   Judgment: Judgment normal.  ? ? ? ? ?   ?Patient has been counseled extensively about nutrition and exercise as well as the importance of adherence with medications and regular follow-up. The patient was given clear instructions to go to ER or return to medical center if symptoms don't improve, worsen or new problems develop. The patient verbalized understanding.  ? ?Follow-up: Return in about 3 months (around 05/08/2022).  ? ?05/10/2022, FNP-BC ?[facility] ?[place], [place] ?[phone]   ?02/05/2022, 9:37 PM ?

## 2022-02-06 ENCOUNTER — Other Ambulatory Visit: Payer: Self-pay

## 2022-02-08 ENCOUNTER — Other Ambulatory Visit: Payer: Self-pay

## 2022-02-08 ENCOUNTER — Other Ambulatory Visit: Payer: Self-pay | Admitting: Nurse Practitioner

## 2022-02-08 DIAGNOSIS — N390 Urinary tract infection, site not specified: Secondary | ICD-10-CM

## 2022-02-08 LAB — MICROSCOPIC EXAMINATION
Bacteria, UA: NONE SEEN
Casts: NONE SEEN /lpf
RBC, Urine: NONE SEEN /hpf (ref 0–2)

## 2022-02-08 LAB — URINALYSIS, COMPLETE
Bilirubin, UA: NEGATIVE
Glucose, UA: NEGATIVE
Ketones, UA: NEGATIVE
Nitrite, UA: NEGATIVE
Protein,UA: NEGATIVE
RBC, UA: NEGATIVE
Specific Gravity, UA: 1.016 (ref 1.005–1.030)
Urobilinogen, Ur: 0.2 mg/dL (ref 0.2–1.0)
pH, UA: 5.5 (ref 5.0–7.5)

## 2022-02-08 LAB — FECAL OCCULT BLOOD, IMMUNOCHEMICAL: Fecal Occult Bld: NEGATIVE

## 2022-02-08 MED ORDER — SULFAMETHOXAZOLE-TRIMETHOPRIM 800-160 MG PO TABS
1.0000 | ORAL_TABLET | Freq: Two times a day (BID) | ORAL | 0 refills | Status: AC
  Filled 2022-02-08: qty 10, 5d supply, fill #0

## 2022-02-08 NOTE — Progress Notes (Deleted)
E-Visit for Urinary Problems ? ?We are sorry that you are not feeling well.  Here is how we plan to help! ? ?Based on what you shared with me it looks like you most likely have a simple urinary tract infection. ? ?A UTI (Urinary Tract Infection) is a bacterial infection of the bladder. ? ?Most cases of urinary tract infections are simple to treat but a key part of your care is to encourage you to drink plenty of fluids and watch your symptoms carefully. ? ?I have prescribed {Evisit uti meds:21012026}.  Your symptoms should gradually improve. Call us if the burning in your urine worsens, you develop worsening fever, back pain or pelvic pain or if your symptoms do not resolve after completing the antibiotic. ? ?Urinary tract infections can be prevented by drinking plenty of water to keep your body hydrated.  Also be sure when you wipe, wipe from front to back and don't hold it in!  If possible, empty your bladder every 4 hours. ? ?HOME CARE ?Drink plenty of fluids ?Compete the full course of the antibiotics even if the symptoms resolve ?Remember, when you need to go?go. Holding in your urine can increase the likelihood of getting a UTI! ?GET HELP RIGHT AWAY IF: ?You cannot urinate ?You get a high fever ?Worsening back pain occurs ?You see blood in your urine ?You feel sick to your stomach or throw up ?You feel like you are going to pass out ? ?MAKE SURE YOU  ?Understand these instructions. ?Will watch your condition. ?Will get help right away if you are not doing well or get worse. ? ? ?Thank you for choosing an e-visit. ? ?Your e-visit answers were reviewed by a board certified advanced clinical practitioner to complete your personal care plan. Depending upon the condition, your plan could have included both over the counter or prescription medications. ? ?Please review your pharmacy choice. Make sure the pharmacy is open so you can pick up prescription now. If there is a problem, you may contact your provider  through [employer] and have the prescription routed to another pharmacy.  Your safety is important to [place]. If you have drug allergies check your prescription carefully.  ? ?For the next 24 hours you can use MyChart to ask questions about today's visit, request a non-urgent call back, or ask for a work or school excuse. ?You will get an email in the next two days asking about your experience. I hope that your e-visit has been valuable and will speed your recovery.  ?

## 2022-02-12 NOTE — Progress Notes (Deleted)
?Cardiology Office Note:   ? ?Date:  02/12/2022  ? ?ID:  Erin Obando, DOB June 18, 1951, MRN [id number] ? ?PCP:  Claiborne Rigg, NP ?  ?[facility] Providers ?Cardiologist:  None { ? ? ?Referring MD: Claiborne Rigg, NP  ? ? ? ?History of Present Illness:   ? ?Annette Weiss is a 71 y.o. female with a hx of HTN, HLD, and GERD who was referred by Bertram Denver, NP for further evaluation of palpitations.  ? ?Today, *** ? ?Past Medical History:  ?Diagnosis Date  ? Hyperlipidemia   ? Hypertension   ? Thyroid disease   ? ? ?No past surgical history on file. ? ?Current Medications: ?No outpatient medications have been marked as taking for the 02/13/22 encounter (Appointment) with Meriam Sprague, MD.  ?  ? ?Allergies:   Patient has no known allergies.  ? ?Social History  ? ?Socioeconomic History  ? Marital status: Married  ?  Spouse name: Not on file  ? Number of children: Not on file  ? Years of education: Not on file  ? Highest education level: Not on file  ?Occupational History  ? Not on file  ?Tobacco Use  ? Smoking status: Never  ? Smokeless tobacco: Never  ?Vaping Use  ? Vaping Use: Never used  ?Substance and Sexual Activity  ? Alcohol use: Never  ? Drug use: Never  ? Sexual activity: Yes  ?Other Topics Concern  ? Not on file  ?Social History Narrative  ? Not on file  ? ?Social Determinants of Health  ? ?Financial Resource Strain: Not on file  ?Food Insecurity: Not on file  ?Transportation Needs: Not on file  ?Physical Activity: Not on file  ?Stress: Not on file  ?Social Connections: Not on file  ?  ? ?Family History: ?The patient's ***family history is not on file. ? ?ROS:   ?Please see the history of present illness.    ?*** All other systems reviewed and are negative. ? ?EKGs/Labs/Other Studies Reviewed:   ? ?The following studies were reviewed today: ?*** ? ?EKG:  EKG is *** ordered today.  The ekg ordered today demonstrates *** ? ?Recent Labs: ?12/28/2021: ALT 11; BUN 15; Creatinine, Ser 0.63; Hemoglobin  13.1; Platelets 184; Potassium 4.7; Sodium 143  ?Recent Lipid Panel ?   ?Component Value Date/Time  ? CHOL 270 (H) 01/18/2021 0950  ? TRIG 108 01/18/2021 0950  ? HDL 70 01/18/2021 0950  ? CHOLHDL 3.9 01/18/2021 0950  ? LDLCALC 181 (H) 01/18/2021 0950  ? ? ? ?Risk Assessment/Calculations:   ?{Does this patient have ATRIAL FIBRILLATION?:[id number]} ? ?    ? ?Physical Exam:   ? ?VS:  There were no vitals taken for this visit.   ? ?Wt Readings from Last 3 Encounters:  ?02/05/22 145 lb 6 oz (65.9 kg)  ?12/28/21 148 lb (67.1 kg)  ?07/18/21 150 lb (68 kg)  ?  ? ?GEN: *** Well nourished, well developed in no acute distress ?HEENT: Normal ?NECK: No JVD; No carotid bruits ?LYMPHATICS: No lymphadenopathy ?CARDIAC: ***RRR, no murmurs, rubs, gallops ?RESPIRATORY:  Clear to auscultation without rales, wheezing or rhonchi  ?ABDOMEN: Soft, non-tender, non-distended ?MUSCULOSKELETAL:  No edema; No deformity  ?SKIN: Warm and dry ?NEUROLOGIC:  Alert and oriented x 3 ?PSYCHIATRIC:  Normal affect  ? ?ASSESSMENT:   ? ?No diagnosis found. ?PLAN:   ? ?In order of problems listed above: ? ?#Palpitations: ?-Check zio monitor ?-Increase hydration ?-Decrease caffeine intake ?-PO Mg at night ? ?#HTN: ?-Continue enalapril 5mg   daily ? ?   ? ?{Are you ordering a CV Procedure (e.g. stress test, cath, DCCV, TEE, etc)?   Press F2        :[id number]}  ? ? ?Medication Adjustments/Labs and Tests Ordered: ?Current medicines are reviewed at length with the patient today.  Concerns regarding medicines are outlined above.  ?No orders of the defined types were placed in this encounter. ? ?No orders of the defined types were placed in this encounter. ? ? ?There are no Patient Instructions on file for this visit.  ? ?Signed, ?Meriam Sprague, MD  ?02/12/2022 8:55 PM    ?[facility] ?

## 2022-02-13 ENCOUNTER — Ambulatory Visit (INDEPENDENT_AMBULATORY_CARE_PROVIDER_SITE_OTHER): Payer: No Typology Code available for payment source | Admitting: Cardiology

## 2022-02-13 ENCOUNTER — Other Ambulatory Visit: Payer: Self-pay

## 2022-02-13 ENCOUNTER — Encounter: Payer: Self-pay | Admitting: Cardiology

## 2022-02-13 VITALS — BP 128/80 | HR 60 | Ht 59.0 in | Wt 144.8 lb

## 2022-02-13 DIAGNOSIS — Z789 Other specified health status: Secondary | ICD-10-CM

## 2022-02-13 DIAGNOSIS — E782 Mixed hyperlipidemia: Secondary | ICD-10-CM

## 2022-02-13 DIAGNOSIS — R002 Palpitations: Secondary | ICD-10-CM

## 2022-02-13 DIAGNOSIS — I10 Essential (primary) hypertension: Secondary | ICD-10-CM

## 2022-02-13 MED ORDER — ENALAPRIL MALEATE 5 MG PO TABS
5.0000 mg | ORAL_TABLET | Freq: Every day | ORAL | 3 refills | Status: DC
  Filled 2022-02-13: qty 90, 90d supply, fill #0

## 2022-02-13 NOTE — Patient Instructions (Signed)
Medication Instructions:   Your physician recommends that you continue on your current medications as directed. Please refer to the Current Medication list given to you today.  *If you need a refill on your cardiac medications before your next appointment, please call your pharmacy*   Testing/Procedures:  Your physician has requested that you have an echocardiogram. Echocardiography is a painless test that uses sound waves to create images of your heart. It provides your doctor with information about the size and shape of your heart and how well your heart's chambers and valves are working. This procedure takes approximately one hour. There are no restrictions for this procedure.   Follow-Up:  AS NEEDED WITH DR. PEMBERTON       

## 2022-02-13 NOTE — Progress Notes (Signed)
?Cardiology Office Note:   ? ?Date:  02/13/2022  ? ?ID:  Annette Weiss, DOB 1951/11/10, MRN [id number] ? ?PCP:  Claiborne Rigg, NP ?  ?[facility] Providers ?Cardiologist:  None { ? ? ?Referring MD: Claiborne Rigg, NP  ? ? ?History of Present Illness:   ? ?Annette Weiss is a 71 y.o. female with a hx of HTN, HLD, and GERD who was referred by Bertram Denver, NP for further evaluation of palpitations.  ? ?On 02/05/2022 she was seen by Bertram Denver, NP. She reported nocturnal hypertensive episodes associated with palpitations. This wakes her up in the middle of the night. During the episodes her BP was reportedly 150-160/80-90s. She had been taking 25 mg captopril as needed when these episodes occur. This usually normalized her blood pressure to 130/80s in about 30 minutes. At that visit she was taking 5 mg amlodipine daily but wished to restart enalapril, which she was taking previously. She was referred to cardiology for palpitations and consideration of event monitor. ? ?She is accompanied by an interpreter. Today, the patient states that she has had irregular and pounding palpitations since starting Norvasc. She did have palpitations prior to starting Norvasc, but they were not as severe. Her palpitations initially started about 6 months ago and only occur at night. The harder, pounding palpitations have an onset of about  2-4 months ago, also during the night. Since her Norvasc was discontinued her palpitations seem to have improved significantly. She is able to sleep better through the night. She also reports her blood pressure has stabilized and has been running mainly 120s/80s.  ? ?She denies any chest pain, shortness of breath, or peripheral edema. No lightheadedness, headaches, syncope, orthopnea, or PND. ? ?She has had persistently elevated cholesterol despite dietary efforts. Has not tolerated statins in the past.  ? ?Past Medical History:  ?Diagnosis Date  ? Hyperlipidemia   ? Hypertension   ? Thyroid  disease   ? ? ?No past surgical history on file. ? ?Current Medications: ?Current Meds  ?Medication Sig  ? omeprazole (PRILOSEC) 20 MG capsule Take 1 capsule (20 mg total) by mouth daily.  ? sulfamethoxazole-trimethoprim (BACTRIM DS) 800-160 MG tablet Take 1 tablet by mouth 2 (two) times daily for 5 days.  ? [DISCONTINUED] enalapril (VASOTEC) 5 MG tablet Take 1 tablet (5 mg total) by mouth daily.  ?  ? ?Allergies:   Patient has no known allergies.  ? ?Social History  ? ?Socioeconomic History  ? Marital status: Married  ?  Spouse name: Not on file  ? Number of children: Not on file  ? Years of education: Not on file  ? Highest education level: Not on file  ?Occupational History  ? Not on file  ?Tobacco Use  ? Smoking status: Never  ? Smokeless tobacco: Never  ?Vaping Use  ? Vaping Use: Never used  ?Substance and Sexual Activity  ? Alcohol use: Never  ? Drug use: Never  ? Sexual activity: Yes  ?Other Topics Concern  ? Not on file  ?Social History Narrative  ? Not on file  ? ?Social Determinants of Health  ? ?Financial Resource Strain: Not on file  ?Food Insecurity: Not on file  ?Transportation Needs: Not on file  ?Physical Activity: Not on file  ?Stress: Not on file  ?Social Connections: Not on file  ?  ? ?Family History: ?The patient's family history is not on file. ? ?ROS:   ?Review of Systems  ?Constitutional:  Negative for chills and  diaphoresis.  ?HENT:  Negative for sinus pain and sore throat.   ?Eyes:  Negative for blurred vision and photophobia.  ?Respiratory:  Negative for sputum production and shortness of breath.   ?Cardiovascular:  Positive for chest pain and palpitations. Negative for orthopnea, claudication, leg swelling and PND.  ?Gastrointestinal:  Negative for diarrhea and melena.  ?Genitourinary:  Negative for flank pain.  ?Musculoskeletal:  Negative for falls.  ?Neurological:  Positive for dizziness. Negative for loss of consciousness.  ?Endo/Heme/Allergies:  Does not bruise/bleed easily.   ?Psychiatric/Behavioral:  Negative for depression and hallucinations.   ? ? ?EKGs/Labs/Other Studies Reviewed:   ? ?The following studies were reviewed today: ? ?No prior cardiovascular studies available. ? ? ?EKG:  EKG is personally reviewed.  ?02/13/2022: Sinus rhythm. Rate 60 bpm. ? ? ?Recent Labs: ?12/28/2021: ALT 11; BUN 15; Creatinine, Ser 0.63; Hemoglobin 13.1; Platelets 184; Potassium 4.7; Sodium 143  ? ?Recent Lipid Panel ?   ?Component Value Date/Time  ? CHOL 270 (H) 01/18/2021 0950  ? TRIG 108 01/18/2021 0950  ? HDL 70 01/18/2021 0950  ? CHOLHDL 3.9 01/18/2021 0950  ? LDLCALC 181 (H) 01/18/2021 0950  ? ? ? ?Risk Assessment/Calculations:   ?  ? ?    ? ?Physical Exam:   ? ?VS:  BP 128/80   Pulse 60   Ht 4\' 11"  (1.499 m)   Wt 144 lb 12.8 oz (65.7 kg)   SpO2 97%   BMI 29.25 kg/m?    ? ?Wt Readings from Last 3 Encounters:  ?02/13/22 144 lb 12.8 oz (65.7 kg)  ?02/05/22 145 lb 6 oz (65.9 kg)  ?12/28/21 148 lb (67.1 kg)  ?  ? ?GEN: Well nourished, well developed in no acute distress ?HEENT: Normal ?NECK: No JVD; No carotid bruits ?LYMPHATICS: No lymphadenopathy ?CARDIAC: RRR, no murmurs, rubs, gallops ?RESPIRATORY:  Clear to auscultation without rales, wheezing or rhonchi  ?ABDOMEN: Soft, non-tender, non-distended ?MUSCULOSKELETAL:  No edema; No deformity  ?SKIN: Warm and dry ?NEUROLOGIC:  Alert and oriented x 3 ?PSYCHIATRIC:  Normal affect  ? ?ASSESSMENT:   ? ?1. Palpitations   ?2. Essential hypertension   ?3. Mixed hyperlipidemia   ?4. Statin intolerance   ? ?PLAN:   ? ?In order of problems listed above: ? ?#Palpitations: ?Significantly improved since stopping amlodipine and transitioning to enalapril. Discussed option of a cardiac monitor, but she declined given improvement of symptoms. She is amenable to undergo TTE to assess further. Will call us back if symptoms recur. ?-Declined cardiac monitor ?-Check TTE ?-Increase hydration ?-Decrease caffeine intake ?-PO Mg at night ? ?#HTN: ?Much better controlled  now that she has started enalapril. Running 120s/80s at home and is well controlled in the office today. ?-Continue enalapril 5mg  daily ? ?#HLD: ?#Statin Intolerance: ?LDL 181. Did not tolerate statins and reports she has an allergy to statin meds. Declined starting zetia today. May be willing to see lipid clinic in the future but declined referral today. ?-Declined zetia and referral to lipid clinic ?-Continue with lifestyle modifications ? ?Exercise recommendations: ?Goal of exercising for at least 30 minutes a day, at least 5 times per week.  Please exercise to a moderate exertion.  This means that while exercising it is difficult to speak in full sentences, however you are not so short of breath that you feel you must stop, and not so comfortable that you can carry on a full conversation.  Exertion level should be approximately a 5/10, if 10 is the most exertion you can perform. ? ?  Diet recommendations: ?Recommend a heart healthy diet such as the Mediterranean diet.  This diet consists of plant based foods, healthy fats, lean meats, olive oil.  It suggests limiting the intake of simple carbohydrates such as white breads, pastries, and pastas.  It also limits the amount of red meat, wine, and dairy products such as cheese that one should consume on a daily basis.  ? ?   ? ?   ?Follow-up: PRN if testing is normal. ? ?Medication Adjustments/Labs and Tests Ordered: ?Current medicines are reviewed at length with the patient today.  Concerns regarding medicines are outlined above.  ? ?Orders Placed This Encounter  ?Procedures  ? EKG 12-Lead  ? ECHOCARDIOGRAM COMPLETE  ? ?Meds ordered this encounter  ?Medications  ? enalapril (VASOTEC) 5 MG tablet  ?  Sig: Take 1 tablet (5 mg total) by mouth daily.  ?  Dispense:  90 tablet  ?  Refill:  3  ? ?Patient Instructions  ?Medication Instructions:  ? ?Your physician recommends that you continue on your current medications as directed. Please refer to the Current Medication list  given to you today. ? ?*If you need a refill on your cardiac medications before your next appointment, please call your pharmacy* ? ? ?Testing/Procedures: ? ?Your physician has requested that you have an echocardiogra

## 2022-02-19 ENCOUNTER — Other Ambulatory Visit: Payer: Self-pay

## 2022-02-26 ENCOUNTER — Ambulatory Visit (HOSPITAL_COMMUNITY): Payer: No Typology Code available for payment source | Attending: Internal Medicine

## 2022-02-26 DIAGNOSIS — I361 Nonrheumatic tricuspid (valve) insufficiency: Secondary | ICD-10-CM

## 2022-02-26 DIAGNOSIS — I340 Nonrheumatic mitral (valve) insufficiency: Secondary | ICD-10-CM

## 2022-02-26 DIAGNOSIS — I351 Nonrheumatic aortic (valve) insufficiency: Secondary | ICD-10-CM

## 2022-02-26 DIAGNOSIS — R002 Palpitations: Secondary | ICD-10-CM | POA: Insufficient documentation

## 2022-02-26 LAB — ECHOCARDIOGRAM COMPLETE
Area-P 1/2: 2.5 cm2
P 1/2 time: 612 msec
S' Lateral: 2.5 cm

## 2022-03-26 ENCOUNTER — Telehealth: Payer: Self-pay | Admitting: Nurse Practitioner

## 2022-03-26 NOTE — Telephone Encounter (Signed)
Copied from [place]. Topic: Referral - Request for Referral ?>> Mar 26, 2022  3:37 PM Valere Dross wrote: ?Has patient seen PCP for this complaint? Yes.   ? ?Referral for which specialty: Gertie Fey ? ?Preferred provider/office: Any office ? ?Reason for referral: Stomach issues ?

## 2022-03-27 ENCOUNTER — Other Ambulatory Visit: Payer: Self-pay

## 2022-03-27 ENCOUNTER — Encounter: Payer: Self-pay | Admitting: Nurse Practitioner

## 2022-03-27 ENCOUNTER — Ambulatory Visit (HOSPITAL_BASED_OUTPATIENT_CLINIC_OR_DEPARTMENT_OTHER): Payer: No Typology Code available for payment source | Admitting: Nurse Practitioner

## 2022-03-27 DIAGNOSIS — K219 Gastro-esophageal reflux disease without esophagitis: Secondary | ICD-10-CM

## 2022-03-27 MED ORDER — PANTOPRAZOLE SODIUM 40 MG PO TBEC
40.0000 mg | DELAYED_RELEASE_TABLET | Freq: Every day | ORAL | 0 refills | Status: DC
  Filled 2022-03-27: qty 90, 90d supply, fill #0

## 2022-03-27 NOTE — Telephone Encounter (Signed)
Please let patient know I will call her at [phone] today to discuss referral to GI . Thank you ?

## 2022-03-27 NOTE — Progress Notes (Signed)
Virtual Visit via Telephone Note ? I discussed the limitations, risks, security and privacy concerns of performing an evaluation and management service by telephone and the availability of in person appointments. I also discussed with the patient that there may be a patient responsible charge related to this service. The patient expressed understanding and agreed to proceed.  ? ? ?I connected with Annette Weiss on 03/27/22  at   3:30 PM EDT  EDT by telephone and verified that I am speaking with the correct person using two identifiers. ? ?Location of Patient: ?Private Residence ?  ?Location of Provider: ?[employer] and [employer] [facility]  ?  ?Persons participating in Telemedicine visit: ?Bertram Denver FNP-BC ?Calvert Cantor  ?  ?History of Present Illness: ?Telemedicine visit for: GERD ? ?States omeprazole 20 mg is ineffective. Currently having to sleep with pillows elevated in bed due to acid reflux. She had an endoscopy several years ago which she reports was normal. I do no have a record of this. She was taking Nexium 40 mg when she was in [place]. She is currently taking pantoprazole 40 mg which she purchased over the counter. Other associated symptoms include bilious reflux, chest pain, deep pressure at base of neck, heartburn, midespigastric pain, symptoms primarily relate to meals, and lying down after meals, and upper abdominal discomfort.   ? ?Past Medical History:  ?Diagnosis Date  ? Hyperlipidemia   ? Hypertension   ? Thyroid disease   ?  ?History reviewed. No pertinent surgical history.  ?History reviewed. No pertinent family history.  ?Social History  ? ?Socioeconomic History  ? Marital status: Married  ?  Spouse name: Not on file  ? Number of children: Not on file  ? Years of education: Not on file  ? Highest education level: Not on file  ?Occupational History  ? Not on file  ?Tobacco Use  ? Smoking status: Never  ? Smokeless tobacco: Never  ?Vaping Use  ? Vaping Use: Never used  ?Substance and  Sexual Activity  ? Alcohol use: Never  ? Drug use: Never  ? Sexual activity: Yes  ?Other Topics Concern  ? Not on file  ?Social History Narrative  ? Not on file  ? ?Social Determinants of Health  ? ?Financial Resource Strain: Not on file  ?Food Insecurity: Not on file  ?Transportation Needs: Not on file  ?Physical Activity: Not on file  ?Stress: Not on file  ?Social Connections: Not on file  ?  ? ?Observations/Objective: ?Awake, alert and oriented x 3 ? ? ?Review of Systems  ?Constitutional:  Negative for fever, malaise/fatigue and weight loss.  ?HENT: Negative.  Negative for nosebleeds.   ?Eyes: Negative.  Negative for blurred vision, double vision and photophobia.  ?Respiratory: Negative.  Negative for cough and shortness of breath.   ?Cardiovascular: Negative.  Negative for chest pain, palpitations and leg swelling.  ?Gastrointestinal:  Positive for heartburn. Negative for nausea and vomiting.  ?Musculoskeletal: Negative.  Negative for myalgias.  ?Neurological: Negative.  Negative for dizziness, focal weakness, seizures and headaches.  ?Psychiatric/Behavioral: Negative.  Negative for suicidal ideas.    ?Assessment and Plan: ?Diagnoses and all orders for this visit: ? ?GERD without esophagitis ?-     Ambulatory referral to Gastroenterology ?-     pantoprazole (PROTONIX) 40 MG tablet; Take 1 tablet (40 mg total) by mouth daily. ?INSTRUCTIONS: Avoid GERD Triggers: acidic, spicy or fried foods, caffeine, coffee, sodas,  alcohol and chocolate.   ? ?  ? ?Follow Up Instructions ?Return if  symptoms worsen or fail to improve.  ? ?  ?I discussed the assessment and treatment plan with the patient. The patient was provided an opportunity to ask questions and all were answered. The patient agreed with the plan and demonstrated an understanding of the instructions. ?  ?The patient was advised to call back or seek an in-person evaluation if the symptoms worsen or if the condition fails to improve as anticipated. ? ?I provided  12 minutes of non-face-to-face time during this encounter including median intraservice time, reviewing previous notes, labs, imaging, medications and explaining diagnosis and management. ? ?Claiborne Rigg, FNP-BC  ?

## 2022-03-27 NOTE — Telephone Encounter (Signed)
Called pt made aware of below message.  

## 2022-03-28 ENCOUNTER — Other Ambulatory Visit: Payer: Self-pay

## 2022-04-02 ENCOUNTER — Telehealth: Payer: Self-pay | Admitting: Nurse Practitioner

## 2022-04-02 ENCOUNTER — Other Ambulatory Visit: Payer: Self-pay | Admitting: *Deleted

## 2022-04-02 ENCOUNTER — Other Ambulatory Visit: Payer: Self-pay

## 2022-04-02 DIAGNOSIS — I10 Essential (primary) hypertension: Secondary | ICD-10-CM

## 2022-04-02 MED ORDER — ENALAPRIL MALEATE 5 MG PO TABS
5.0000 mg | ORAL_TABLET | Freq: Every day | ORAL | 0 refills | Status: DC
  Filled 2022-04-02: qty 90, 90d supply, fill #0

## 2022-04-02 NOTE — Telephone Encounter (Signed)
Sent rx request to pharmacy ?

## 2022-04-02 NOTE — Telephone Encounter (Signed)
Pt came in stating that she needed a refill on her med Enelapril 5 mg tablet ?

## 2022-04-03 ENCOUNTER — Other Ambulatory Visit: Payer: Self-pay

## 2022-04-04 ENCOUNTER — Emergency Department (HOSPITAL_COMMUNITY): Payer: No Typology Code available for payment source

## 2022-04-04 ENCOUNTER — Emergency Department (HOSPITAL_COMMUNITY)
Admission: EM | Admit: 2022-04-04 | Discharge: 2022-04-04 | Disposition: A | Discharge status: Home / Self Care | Payer: No Typology Code available for payment source | Attending: Emergency Medicine | Admitting: Emergency Medicine

## 2022-04-04 DIAGNOSIS — K219 Gastro-esophageal reflux disease without esophagitis: Secondary | ICD-10-CM | POA: Insufficient documentation

## 2022-04-04 DIAGNOSIS — J029 Acute pharyngitis, unspecified: Secondary | ICD-10-CM | POA: Insufficient documentation

## 2022-04-04 DIAGNOSIS — H9209 Otalgia, unspecified ear: Secondary | ICD-10-CM | POA: Insufficient documentation

## 2022-04-04 DIAGNOSIS — R0981 Nasal congestion: Secondary | ICD-10-CM | POA: Insufficient documentation

## 2022-04-04 LAB — CBC WITH DIFFERENTIAL/PLATELET
Abs Immature Granulocytes: 0.04 10*3/uL (ref 0.00–0.07)
Basophils Absolute: 0.1 10*3/uL (ref 0.0–0.1)
Basophils Relative: 1 %
Eosinophils Absolute: 0.2 10*3/uL (ref 0.0–0.5)
Eosinophils Relative: 2 %
HCT: 37.6 % (ref 36.0–46.0)
Hemoglobin: 11.9 g/dL — ABNORMAL LOW (ref 12.0–15.0)
Immature Granulocytes: 0 %
Lymphocytes Relative: 16 %
Lymphs Abs: 1.7 10*3/uL (ref 0.7–4.0)
MCH: 29.2 pg (ref 26.0–34.0)
MCHC: 31.6 g/dL (ref 30.0–36.0)
MCV: 92.2 fL (ref 80.0–100.0)
Monocytes Absolute: 0.7 10*3/uL (ref 0.1–1.0)
Monocytes Relative: 7 %
Neutro Abs: 7.7 10*3/uL (ref 1.7–7.7)
Neutrophils Relative %: 74 %
Platelets: 185 10*3/uL (ref 150–400)
RBC: 4.08 MIL/uL (ref 3.87–5.11)
RDW: 12.1 % (ref 11.5–15.5)
WBC: 10.4 10*3/uL (ref 4.0–10.5)
nRBC: 0 % (ref 0.0–0.2)

## 2022-04-04 LAB — BASIC METABOLIC PANEL
Anion gap: 6 (ref 5–15)
BUN: 10 mg/dL (ref 8–23)
CO2: 26 mmol/L (ref 22–32)
Calcium: 9.5 mg/dL (ref 8.9–10.3)
Chloride: 105 mmol/L (ref 98–111)
Creatinine, Ser: 0.64 mg/dL (ref 0.44–1.00)
GFR, Estimated: >60 mL/min (ref >60)
Glucose, Bld: 109 mg/dL — ABNORMAL HIGH (ref 70–99)
Potassium: 4.1 mmol/L (ref 3.5–5.1)
Sodium: 137 mmol/L (ref 135–145)

## 2022-04-04 NOTE — ED Triage Notes (Signed)
Pt. Stated, Ive had congestion and cant breat out of my nose and acid reflux for 9 months ?

## 2022-04-04 NOTE — ED Provider Notes (Signed)
[facility]   CSN: [id number] Arrival date & time: 04/04/22  0934     History  Chief Complaint  Patient presents with   Nasal Congestion   Gastroesophageal Reflux    Annette Weiss is a 71 y.o. female with history of GERD presents to the ED for possibly worsening GERD symptoms and nasal congestion.  Patient has been dealing with reflux for several months which is being managed by her primary care doctor.  She is currently taking Protonix 40 mg daily.  However in the last 2 weeks, patient has also been experiencing nasal congestion, ear pain and sore throat.  She was concerned last night that she felt as if she was unable to breathe out of her nose due to the degree of swelling.  Upon asking, she also endorses mildly productive cough.  She denies fevers, chills, chest pain, abdominal pain and/V/D.   Gastroesophageal Reflux      Home Medications Prior to Admission medications   Medication Sig Start Date End Date Taking? Authorizing Provider  enalapril (VASOTEC) 5 MG tablet Take 1 tablet (5 mg total) by mouth daily. 04/02/22   Claiborne RiggFleming, Zelda W, NP  pantoprazole (PROTONIX) 40 MG tablet Take 1 tablet (40 mg total) by mouth daily. 03/27/22 06/26/22  Claiborne RiggFleming, Zelda W, NP      Allergies    Patient has no known allergies.    Review of Systems   Review of Systems  Physical Exam Updated Vital Signs BP 140/64 (BP Location: Right Arm)   Pulse 75   Temp 98.4 F (36.9 C) (Oral)   Resp 16   Ht 5' (1.524 m)   Wt 59 kg   SpO2 97%   BMI 25.39 kg/m  Physical Exam Vitals and nursing note reviewed.  Constitutional:      General: She is not in acute distress.    Appearance: She is not ill-appearing.  HENT:     Head: Atraumatic.     Nose:     Right Turbinates: Swollen.     Left Turbinates: Swollen.     Comments: Bilateral swelling of turbinates, no polyps    Mouth/Throat:     Pharynx: Uvula midline. Posterior oropharyngeal erythema  present. No oropharyngeal exudate.  Eyes:     Conjunctiva/sclera: Conjunctivae normal.  Cardiovascular:     Rate and Rhythm: Normal rate and regular rhythm.     Pulses: Normal pulses.     Heart sounds: No murmur heard. Pulmonary:     Effort: Pulmonary effort is normal. No respiratory distress.     Breath sounds: Normal breath sounds.  Abdominal:     General: Abdomen is flat. There is no distension.     Palpations: Abdomen is soft.     Tenderness: There is no abdominal tenderness.  Musculoskeletal:        General: Normal range of motion.     Cervical back: Normal range of motion.  Skin:    General: Skin is warm and dry.     Capillary Refill: Capillary refill takes less than 2 seconds.  Neurological:     General: No focal deficit present.     Mental Status: She is alert.  Psychiatric:        Mood and Affect: Mood normal.    ED Results / Procedures / Treatments   Labs (all labs ordered are listed, but only abnormal results are displayed) Labs Reviewed  BASIC METABOLIC PANEL - Abnormal; Notable for the following components:  Result Value   Glucose, Bld 109 (*)    All other components within normal limits  CBC WITH DIFFERENTIAL/PLATELET - Abnormal; Notable for the following components:   Hemoglobin 11.9 (*)    All other components within normal limits    EKG None  Radiology DG Chest 2 View  Result Date: 04/04/2022 CLINICAL DATA:  Cough and congestion EXAM: CHEST - 2 VIEW COMPARISON:  None Available. FINDINGS: Mild hyperinflation. Remote right seventh posterolateral rib fracture. Osteopenia. Moderate convex right thoracic spine curvature, distorting the appearance of the chest on the frontal radiograph. Normal heart size. Atherosclerosis in the transverse aorta. No pleural effusion or pneumothorax. Clear lungs. IMPRESSION: No acute cardiopulmonary disease. Aortic Atherosclerosis (ICD10-I70.0). Electronically Signed   By: Jeronimo Greaves M.D.   On: 04/04/2022 14:06     Procedures Procedures    Medications Ordered in ED Medications - No data to display  ED Course/ Medical Decision Making/ A&P                           Medical Decision Making Amount and/or Complexity of Data Reviewed Labs: ordered.   Social determinants of health:  Social History   Socioeconomic History   Marital status: Married    Spouse name: Not on file   Number of children: Not on file   Years of education: Not on file   Highest education level: Not on file  Occupational History   Not on file  Tobacco Use   Smoking status: Never   Smokeless tobacco: Never  Vaping Use   Vaping Use: Never used  Substance and Sexual Activity   Alcohol use: Never   Drug use: Never   Sexual activity: Yes  Other Topics Concern   Not on file  Social History Narrative   Not on file   Social Determinants of Health   Financial Resource Strain: Not on file  Food Insecurity: Not on file  Transportation Needs: Not on file  Physical Activity: Not on file  Stress: Not on file  Social Connections: Not on file  Intimate Partner Violence: Not on file     Initial impression:  This patient presents to the ED for concern of congestion and difficulty breathing at night, this involves an extensive number of treatment options, and is a complaint that carries with it a high risk of complications and morbidity.   Differentials include URI, GERD, pneumonia.   Comorbidities affecting care:  none  Additional history obtained: Family med records from recent visit - medication change for GERD  Lab Tests  I Ordered, reviewed, and interpreted labs and EKG.  The pertinent results include:  BMP and CBC normal   Imaging Studies ordered:  I ordered imaging studies including  Chest xray normal  I independently visualized and interpreted imaging and I agree with the radiologist interpretation.    ED Course/Re-evaluation: 71 year old female in no acute distress, nontoxic-appearing presents  to the ED for evaluation of congestion resulting in a difficulty breathing at night.  On exam, her vitals are normal.  She is speaking in full and complete sentences.  No wheezing rales or rhonchi on lung exam.  Her bilateral turbinates are swollen.  Chest x-ray without evidence of pneumonia.  I believe patient symptoms are secondary to a new upper respiratory infection.  Reportedly her grandson has recently gotten sick and she has been in contact with him.  Advised patient to attempt Flonase and Mucinex.  I did consider prescribing antibiotics,  however since patient is afebrile without any findings on her chest x-ray I did not think this was clinically indicated at this time.  However, since she is close to the 2-week mark for symptom onset, if her symptoms do not improve in the next few days, however advised that she follow-up with her family medicine doctor for possible antibiotic therapy.  Patient expresses understanding and is amenable to plan. My attending, Dr. Anitra Lauth, also evaluated this patient and contributed to the history taking and agrees with plan.  Disposition:  After consideration of the diagnostic results, physical exam, history and the patients response to treatment feel that the patent would benefit from discharge.   Nasal congestion: Plan and management as described above. Discharged home in good condition.   Final Clinical Impression(s) / ED Diagnoses Final diagnoses:  Nasal congestion    Rx / DC Orders ED Discharge Orders     None         Janell Quiet, PA-C 04/04/22 1433    Gwyneth Sprout, MD 04/05/22 [phone]

## 2022-04-04 NOTE — Discharge Instructions (Signed)
Your labs today were very reassuring and your chest x-ray was without signs of pneumonia.  I believe that your symptoms are secondary to a viral upper respiratory infection instead of GERD symptoms.  I recommend that you pick up a medication called Flonase as well as Mucinex, both of which she can get over-the-counter.  The Flonase you will spray into your nose to help with some of the swelling.  Mucinex will help expel your congestion.  If your symptoms persist after several more days or they worsen or you develop a fever, you can follow-up with your primary care doctor or urgent care as you may need antibiotics at that point.  Otherwise I hope you feel better soon. ?

## 2022-04-04 NOTE — ED Provider Triage Note (Signed)
Emergency Medicine Provider Triage Evaluation Note  Annette Weiss , a 71 y.o. female  was evaluated in triage.  Pt complains of cold symptoms with difficulty breathing last night.  Patient states that she has fairly severe acid reflux which chronically causes sore throat and nasal congestion, however her symptoms seem to have increased over the last couple of days.  She was very concerned last night as she felt her symptoms severe enough she did not feel that she can breathe adequately due to swelling in her throat.  She takes omeprazole and another unknown reflux medication which normally helps throughout the day, but does not alleviate her symptoms at night.  She is unsure if this is new cold symptoms versus worsening of her reflux.  She denies fevers, chills, abdominal pain, nausea, vomiting and diarrhea..  Review of Systems  Positive: Sore throat, congestion Negative: As above  Physical Exam  BP 140/64 (BP Location: Right Arm)   Pulse 75   Temp 98.4 F (36.9 C) (Oral)   Resp 16   SpO2 97%  Gen:   Awake, no distress Resp:  Normal effort  MSK:   Moves extremities without difficulty  Other:  Oropharynx grossly erythematous, airway intact  Medical Decision Making  Medically screening exam initiated at 10:02 AM.  Appropriate orders placed.  Annette Weiss was informed that the remainder of the evaluation will be completed by another provider, this initial triage assessment does not replace that evaluation, and the importance of remaining in the ED until their evaluation is complete.     Janell Quiet, [place] 04/04/22 1005

## 2022-04-12 ENCOUNTER — Other Ambulatory Visit: Payer: Self-pay

## 2022-04-12 ENCOUNTER — Encounter: Payer: Self-pay | Admitting: Physician Assistant

## 2022-04-12 ENCOUNTER — Ambulatory Visit (INDEPENDENT_AMBULATORY_CARE_PROVIDER_SITE_OTHER): Payer: No Typology Code available for payment source | Admitting: Physician Assistant

## 2022-04-12 VITALS — BP 130/68 | HR 66 | Ht 60.5 in | Wt 137.4 lb

## 2022-04-12 DIAGNOSIS — R1084 Generalized abdominal pain: Secondary | ICD-10-CM

## 2022-04-12 DIAGNOSIS — R634 Abnormal weight loss: Secondary | ICD-10-CM

## 2022-04-12 DIAGNOSIS — K219 Gastro-esophageal reflux disease without esophagitis: Secondary | ICD-10-CM

## 2022-04-12 DIAGNOSIS — K2100 Gastro-esophageal reflux disease with esophagitis, without bleeding: Secondary | ICD-10-CM

## 2022-04-12 MED ORDER — RABEPRAZOLE SODIUM 20 MG PO TBEC
20.0000 mg | DELAYED_RELEASE_TABLET | Freq: Every day | ORAL | 3 refills | Status: DC
  Filled 2022-04-12: qty 30, 30d supply, fill #0

## 2022-04-12 NOTE — Patient Instructions (Signed)
If you are age 73 or older, your body mass index should be between 23-30. Your Body mass index is 26.39 kg/m. If this is out of the aforementioned range listed, please consider follow up with your Primary Care Provider. ________________________________________________________  The [facility] providers would like to encourage you to use [facility] to communicate with providers for non-urgent requests or questions.  Due to long hold times on the telephone, sending your provider a message by [facility] may be a faster and more efficient way to get a response.  Please allow 48 business hours for a response.  Please remember that this is for non-urgent requests.  _______________________________________________________  Dennis Bast have been scheduled for a CT scan of the abdomen and pelvis at [facility], 1st floor Radiology. You are scheduled on 04/17/2022  at 3:00 pm. You should arrive 15 minutes prior to your appointment time for registration.  Please pick up 2 bottles of contrast from [place] at least 3 days prior to your scan. The solution may taste better if refrigerated, but do NOT add ice or any other liquid to this solution. Shake well before drinking.   Please follow the written instructions below on the day of your exam:   1) Do not eat anything after 11:00 am (4 hours prior to your test)   2) Drink 1 bottle of contrast @ 1:00 pm (2 hours prior to your exam)  Remember to shake well before drinking and do NOT pour over ice.     Drink 1 bottle of contrast @ 2:00 pm (1 hour prior to your exam)   You may take any medications as prescribed with a small amount of water, if necessary. If you take any of the following medications: METFORMIN, GLUCOPHAGE, GLUCOVANCE, AVANDAMET, RIOMET, FORTAMET, [place] MET, JANUMET, GLUMETZA or METAGLIP, you MAY be asked to HOLD this medication 48 hours AFTER the exam.   The purpose of you drinking the oral contrast is to aid in the visualization of your intestinal  tract. The contrast solution may cause some diarrhea. Depending on your individual set of symptoms, you may also receive an intravenous injection of x-ray contrast/dye. Plan on being at [facility] for 45 minutes or longer, depending on the type of exam you are having performed.   If you have any questions regarding your exam or if you need to reschedule, you may call [facility] at [phone] between the hours of 8:00 am and 5:00 pm, Monday-Friday.   STOP Omeprazole  START Aciphex (Rabeprazole) 20 mg 1 tablet every morning before breakfast  START Miralax 1 capful in 8 ounces of water or juice daily  Try drinking a glass of prune juice daily at bedtime.  Start an anti-reflux regimen.  You have been scheduled to follow up with on May 15, 2022 at 2:15 pm  Thank you for entrusting me with your care and choosing [facility]  Amy Esterwood, PA-C

## 2022-04-12 NOTE — Progress Notes (Signed)
Subjective:    Patient ID: Annette Weiss, female    DOB: Sep 18, 1951, 71 y.o.   MRN: [id number]  HPI  Annette Weiss is a 71 year old [place] speaking female from [place], referred today by Geryl Rankins, NP for evaluation of refractory reflux type symptoms, sour brash, some early satiety type symptoms and weight loss of about 24 pounds over the past 8 months. Patient did have EGD 3 to 4 years ago while living in [place] and says this was done for abdominal pain and that she was not told of any findings.  She is not aware of having chronic esophagitis, hiatal hernia etc.  She has been on several PPIs in the past.  She had been on low-dose omeprazole, then given a course of Protonix 40 mg daily but she did not tolerate that well with side effect of dry mouth and headaches.  Now over the past several months she has been back on omeprazole 20 mg daily but says that is not helping her symptoms.  She is not using any NSAIDs, no EtOH. He has been following an antireflux diet.  She says on a daily basis she has reflux symptoms and sour brash type symptoms after eating, also feels that she gets a sore throat after eating and some nasal congestion after eating.  When lying down at bedtime she has frequent reflux type symptoms and sour brash.  No dysphagia or odynophagia.  She says she is only eating very small portions at this point in time in order to try to help control the symptoms, does feel that she fills up quickly at times, and has gradually lost 24 pounds.  She is also having constipation symptoms with a bowel movement every 2 to 3 days and complains of pain across the lower abdomen which has not been severe but persistent over several months. She believes she had a colonoscopy greater than 10 years ago while living in [place].  We discussed repeat EGD which she is reluctant to do because she says that her symptoms have been worse in the past after procedures and she feels that it has aggravated her symptoms.  Review  of Systems Pertinent positive and negative review of systems were noted in the above HPI section.  All other review of systems was otherwise negative.   Outpatient Encounter Medications as of 04/12/2022  Medication Sig   enalapril (VASOTEC) 5 MG tablet Take 1 tablet (5 mg total) by mouth daily.   RABEprazole (ACIPHEX) 20 MG tablet Take 1 tablet (20 mg total) by mouth daily.   [DISCONTINUED] omeprazole (PRILOSEC) 20 MG capsule Take 20 mg by mouth daily.   [DISCONTINUED] pantoprazole (PROTONIX) 40 MG tablet Take 1 tablet (40 mg total) by mouth daily.   No facility-administered encounter medications on file as of 04/12/2022.   No Known Allergies Patient Active Problem List   Diagnosis Date Noted   Essential hypertension 07/21/2021   Influenza vaccine refused 01/18/2021   COVID-19 vaccine dose declined 01/18/2021   Tetanus, diphtheria, and acellular pertussis (Tdap) vaccination declined 01/18/2021   Social History   Socioeconomic History   Marital status: Married    Spouse name: Not on file   Number of children: Not on file   Years of education: Not on file   Highest education level: Not on file  Occupational History   Not on file  Tobacco Use   Smoking status: Never   Smokeless tobacco: Never  Vaping Use   Vaping Use: Never used  Substance and Sexual Activity  Alcohol use: Never   Drug use: Never   Sexual activity: Yes  Other Topics Concern   Not on file  Social History Narrative   Not on file   Social Determinants of Health   Financial Resource Strain: Not on file  Food Insecurity: Not on file  Transportation Needs: Not on file  Physical Activity: Not on file  Stress: Not on file  Social Connections: Not on file  Intimate Partner Violence: Not on file    Annette Weiss's family history includes Heart disease in her mother; Hypertension in her mother; Stroke in her father.      Objective:    Vitals:   04/12/22 1420  BP: 130/68  Pulse: 66    Physical Exam  Well-developed well-nourished older white female in no acute distress.  Accompanied by [place] interpreter , Weight, 137 BMI 26.3  HEENT; nontraumatic normocephalic, EOMI, PE R LA, sclera anicteric. Oropharynx; not examined today Neck; supple, no JVD Cardiovascular; regular rate and rhythm with S1-S2, no murmur rub or gallop Pulmonary; Clear bilaterally Abdomen; soft, nondistended, there is some mild tenderness across the mid and lower abdomen, no guarding or rebound no palpable mass or hepatosplenomegaly, bowel sounds are active Rectal; not done today Skin; benign exam, no jaundice rash or appreciable lesions Extremities; no clubbing cyanosis or edema skin warm and dry Neuro/Psych; alert and oriented x4, grossly nonfocal mood and affect appropriate        Assessment & Plan:   #34 71 year old Russian-speaking female, from [place] with what sounds like chronic acid reflux, with significant worsening in symptoms over the past 8 months, associated with gradual weight loss of 24 pounds, no dysphagia or odynophagia but has had some early satiety symptoms and is eating very small portions due to symptoms Increase in symptomatology at night. Prior EGD in [place] 3 to 4 years ago-patient was not told of any specific diagnosis.  Not clear at this time whether her symptoms are all secondary to refractory reflux, or if there is something driving this worsening of symptoms over the past 8 months, rule out gastroparesis, rule out neoplasm,  Is also complaining of mid and lower abdominal discomfort bilaterally which has been present over many months.  #2 constipation-chronic #3 colon cancer screening-last colonoscopy greater than 10 years ago/[place], no family history  Plan; patient would like to try Aciphex 20 mg p.o. every morning, will stop omeprazole and have sent new prescription for Aciphex. Strict antireflux regimen, n.p.o. for 3 hours prior to bedtime and elevation of back 45 degrees for sleep I  think she should have repeat EGD, she is reluctant.  We discussed barium swallow which she would like to think about. We will proceed with CT of the abdomen and pelvis to rule out other intra-abdominal pathology, neoplasm. She did have some labs done earlier this month with normal CBC and be met. Start MiraLAX 17 g daily in 6 to 8 ounces of water, we also discussed mixing MiraLAX with prune juice daily. We will wait for results of CT, then determine need for further endoscopic evaluation with EGD and colonoscopy.  Patient will be established with Dr. Candis Schatz.   Annette Frieson Genia Harold PA-C 04/12/2022   Cc: Gildardo Pounds, NP

## 2022-04-13 ENCOUNTER — Other Ambulatory Visit: Payer: Self-pay

## 2022-04-14 NOTE — Progress Notes (Signed)
Agree with the assessment and plan as outlined by Amy Esterwood, PA-C.  Willy Pinkerton E. Kaiya Boatman, MD  

## 2022-04-17 ENCOUNTER — Ambulatory Visit (HOSPITAL_COMMUNITY)
Admission: RE | Admit: 2022-04-17 | Discharge: 2022-04-17 | Disposition: A | Discharge status: Home / Self Care | Payer: No Typology Code available for payment source | Source: Ambulatory Visit | Attending: Physician Assistant | Admitting: Physician Assistant

## 2022-04-17 ENCOUNTER — Other Ambulatory Visit: Payer: Self-pay

## 2022-04-17 DIAGNOSIS — R634 Abnormal weight loss: Secondary | ICD-10-CM | POA: Insufficient documentation

## 2022-04-17 DIAGNOSIS — K2100 Gastro-esophageal reflux disease with esophagitis, without bleeding: Secondary | ICD-10-CM | POA: Insufficient documentation

## 2022-04-17 DIAGNOSIS — R1084 Generalized abdominal pain: Secondary | ICD-10-CM | POA: Insufficient documentation

## 2022-04-17 DIAGNOSIS — K219 Gastro-esophageal reflux disease without esophagitis: Secondary | ICD-10-CM | POA: Insufficient documentation

## 2022-04-17 MED ORDER — IOHEXOL 300 MG/ML  SOLN
100.0000 mL | Freq: Once | INTRAMUSCULAR | Status: AC | PRN
  Administered 2022-04-17: 100 mL via INTRAVENOUS

## 2022-05-15 ENCOUNTER — Encounter: Payer: Self-pay | Admitting: Gastroenterology

## 2022-05-15 ENCOUNTER — Ambulatory Visit (INDEPENDENT_AMBULATORY_CARE_PROVIDER_SITE_OTHER): Payer: No Typology Code available for payment source | Admitting: Gastroenterology

## 2022-05-15 VITALS — BP 130/70 | HR 69 | Ht 61.0 in | Wt 135.0 lb

## 2022-05-15 DIAGNOSIS — K5900 Constipation, unspecified: Secondary | ICD-10-CM

## 2022-05-15 DIAGNOSIS — K219 Gastro-esophageal reflux disease without esophagitis: Secondary | ICD-10-CM

## 2022-05-15 DIAGNOSIS — R634 Abnormal weight loss: Secondary | ICD-10-CM

## 2022-05-15 DIAGNOSIS — R1012 Left upper quadrant pain: Secondary | ICD-10-CM | POA: Insufficient documentation

## 2022-05-16 ENCOUNTER — Inpatient Hospital Stay (HOSPITAL_COMMUNITY): Admission: RE | Admit: 2022-05-16 | Payer: No Typology Code available for payment source | Source: Ambulatory Visit

## 2022-05-17 NOTE — Progress Notes (Signed)
Agree with the assessment and plan as outlined by Doug Sou, PA-C.  Would also recommend the patient consider a stool-based colon cancer screening option if she is hesitant to repeat a colonoscopy.  Annette Weiss E. Tomasa Rand, MD  [facility]

## 2022-05-21 ENCOUNTER — Telehealth: Payer: Self-pay | Admitting: Gastroenterology

## 2022-05-28 NOTE — Telephone Encounter (Signed)
Sent message to Radiology Scheduling for patient to be rescheduled.

## 2022-08-18 ENCOUNTER — Emergency Department (HOSPITAL_COMMUNITY)
Admission: EM | Admit: 2022-08-18 | Discharge: 2022-08-18 | Disposition: A | Discharge status: Home / Self Care | Payer: Medicaid Other | Attending: Emergency Medicine | Admitting: Emergency Medicine

## 2022-08-18 ENCOUNTER — Emergency Department (HOSPITAL_COMMUNITY): Payer: Medicaid Other

## 2022-08-18 ENCOUNTER — Other Ambulatory Visit: Payer: Self-pay

## 2022-08-18 DIAGNOSIS — B349 Viral infection, unspecified: Secondary | ICD-10-CM

## 2022-08-18 DIAGNOSIS — Z8616 Personal history of COVID-19: Secondary | ICD-10-CM | POA: Diagnosis not present

## 2022-08-18 DIAGNOSIS — Z20822 Contact with and (suspected) exposure to covid-19: Secondary | ICD-10-CM | POA: Diagnosis not present

## 2022-08-18 DIAGNOSIS — R0602 Shortness of breath: Secondary | ICD-10-CM | POA: Diagnosis present

## 2022-08-18 LAB — CBC
HCT: 38.1 % (ref 36.0–46.0)
Hemoglobin: 12.3 g/dL (ref 12.0–15.0)
MCH: 29.1 pg (ref 26.0–34.0)
MCHC: 32.3 g/dL (ref 30.0–36.0)
MCV: 90.1 fL (ref 80.0–100.0)
Platelets: 189 10*3/uL (ref 150–400)
RBC: 4.23 MIL/uL (ref 3.87–5.11)
RDW: 12.3 % (ref 11.5–15.5)
WBC: 6.1 10*3/uL (ref 4.0–10.5)
nRBC: 0 % (ref 0.0–0.2)

## 2022-08-18 LAB — BASIC METABOLIC PANEL
Anion gap: 10 (ref 5–15)
BUN: 9 mg/dL (ref 8–23)
CO2: 22 mmol/L (ref 22–32)
Calcium: 9.6 mg/dL (ref 8.9–10.3)
Chloride: 108 mmol/L (ref 98–111)
Creatinine, Ser: 0.64 mg/dL (ref 0.44–1.00)
GFR, Estimated: >60 mL/min (ref >60)
Glucose, Bld: 119 mg/dL — ABNORMAL HIGH (ref 70–99)
Potassium: 3.9 mmol/L (ref 3.5–5.1)
Sodium: 140 mmol/L (ref 135–145)

## 2022-08-18 LAB — TROPONIN I (HIGH SENSITIVITY): Troponin I (High Sensitivity): 5 ng/L (ref <18)

## 2022-08-18 LAB — SARS CORONAVIRUS 2 BY RT PCR: SARS Coronavirus 2 by RT PCR: NEGATIVE

## 2022-08-18 MED ORDER — BENZONATATE 100 MG PO CAPS
100.0000 mg | ORAL_CAPSULE | Freq: Three times a day (TID) | ORAL | 0 refills | Status: DC | PRN
  Filled 2022-08-18: qty 21, 7d supply, fill #0

## 2022-08-18 NOTE — ED Provider Notes (Signed)
[facility] EMERGENCY DEPARTMENT Provider Note   CSN: [id number] Arrival date & time: 08/18/22  1102     History  Chief Complaint  Patient presents with   Chest Pain   Shortness of Breath    Annette Weiss is a 71 y.o. female.  The history is provided by the patient and medical records. No language interpreter was used.  Chest Pain Associated symptoms: shortness of breath   Shortness of Breath Associated symptoms: chest pain      Annette Weiss is a 71yo female with PMHx of childhood TB, GERD, and recent overseas travel who presents to ED for chest pain. She states that pain started about one month ago after she flew back from [place]. She became sick shortly after returning, endorsing URI Sx of tiredness, ear fullness, nasal congestion, sore throat, and cough. Over the past few weeks, her cough has persisted and continues to produce green sputum. She states that when she deep breathes, it makes her cough, which produces the pain. She can feel this pain from her throat to mid sternum. It does not radiate to her arm or jaw and she denies diaphoresis, N/V/D, or syncope. She endorses dizziness that occurs when waking in the morning but is unrelated to chest pain.   Home Medications Prior to Admission medications   Medication Sig Start Date End Date Taking? Authorizing Provider  enalapril (VASOTEC) 5 MG tablet Take 1 tablet (5 mg total) by mouth daily. 04/02/22   Claiborne Rigg, NP  RABEprazole (ACIPHEX) 20 MG tablet Take 1 tablet (20 mg total) by mouth daily. 04/12/22   Esterwood, Amy S, PA-C      Allergies    Patient has no known allergies.    Review of Systems   Review of Systems  Respiratory:  Positive for shortness of breath.   Cardiovascular:  Positive for chest pain.  All other systems reviewed and are negative.   Physical Exam Updated Vital Signs BP 133/61   Pulse (!) 56   Temp 98.1 F (36.7 C) (Oral)   Resp 14   Ht 5\' 1"  (1.549 m)   Wt 61.2 kg    SpO2 99%   BMI 25.49 kg/m  Physical Exam Vitals and nursing note reviewed.  Constitutional:      General: She is not in acute distress.    Appearance: She is well-developed.  HENT:     Head: Atraumatic.  Eyes:     Conjunctiva/sclera: Conjunctivae normal.  Cardiovascular:     Rate and Rhythm: Normal rate and regular rhythm.     Pulses: Normal pulses.     Heart sounds: Normal heart sounds.  Pulmonary:     Effort: Pulmonary effort is normal.     Breath sounds: Normal breath sounds. No wheezing, rhonchi or rales.  Abdominal:     Palpations: Abdomen is soft.     Tenderness: There is no abdominal tenderness.  Musculoskeletal:     Cervical back: Neck supple.     Right lower leg: No edema.     Left lower leg: No edema.  Skin:    Findings: No rash.  Neurological:     Mental Status: She is alert. Mental status is at baseline.  Psychiatric:        Mood and Affect: Mood normal.     ED Results / Procedures / Treatments   Labs (all labs ordered are listed, but only abnormal results are displayed) Labs Reviewed  BASIC METABOLIC PANEL - Abnormal; Notable for the  following components:      Result Value   Glucose, Bld 119 (*)    All other components within normal limits  SARS CORONAVIRUS 2 BY RT PCR  CBC  TROPONIN I (HIGH SENSITIVITY)    EKG None ED ECG REPORT   Date: 08/18/2022  Rate: 59  Rhythm: sinus bradycardia  QRS Axis: normal  Intervals: normal  ST/T Wave abnormalities: normal  Conduction Disutrbances:none  Narrative Interpretation:   Old EKG Reviewed: unchanged  I have personally reviewed the EKG tracing and agree with the computerized printout as noted.   Radiology DG Chest 2 View  Result Date: 08/18/2022 CLINICAL DATA:  Chest pain. Shortness of breath. Green sputum. Left shoulder pain. Right lower abdominal pain. EXAM: CHEST - 2 VIEW COMPARISON:  Prior chest radiographs 04/04/2022 and earlier FINDINGS: Heart size within normal limits. Aortic  atherosclerosis. No appreciable airspace consolidation or pulmonary edema. No evidence of pleural effusion or pneumothorax. No acute bony abnormality identified. Redemonstrated chronic fracture deformity of the right seventh rib. Dextrocurvature of the thoracic spine. IMPRESSION: No evidence of acute cardiopulmonary abnormality. Aortic Atherosclerosis (ICD10-I70.0). Electronically Signed   By: Jackey Loge D.O.   On: 08/18/2022 11:59    Procedures Procedures    Medications Ordered in ED Medications - No data to display  ED Course/ Medical Decision Making/ A&P                           Medical Decision Making Amount and/or Complexity of Data Reviewed Labs: ordered. Radiology: ordered.   BP 133/61   Pulse (!) 56   Temp 98.1 F (36.7 C) (Oral)   Resp 14   Ht 5\' 1"  (1.549 m)   Wt 61.2 kg   SpO2 99%   BMI 25.49 kg/m   30:78 PM  71 year old female significant history of childhood TB, GERD, who presents with complaints of cold symptoms.  Patient states she was on vacation at 63 and recently flew back to [place] approximately 10 days ago.  A few days back she developed cold symptoms.  She described as cough productive with phlegm, headache, body aches and overall not feeling well.  She endorses fatigue.  She does not endorse any fever no hemoptysis.  She denies any leg swelling or calf pain.  No prior history of PE or DVT.  She mentions she has had COVID in the past and given her symptoms she would like to be evaluated.  She denies any significant cardiac history.  She mentioned her symptom is improving except for her lingering cough.  She does endorse some mild pleuritic chest pain.  On exam this is a well-appearing elderly female appears to be in no acute discomfort.  She speaks in complete sentences.  Heart lung sounds normal.  Abdomen soft nontender.  No peripheral edema no calf tenderness on exam.  She is afebrile vital signs normal stable and no hypoxia.  Labs, EKG, and imaging  obtained independently viewed interpreted by me and I agree with radiologist interpretation.  EKG without concerning ischemia abnormal arrhythmia.  Normal troponin.  WBC, normal H&H, electrolyte panels are reassuring, chest x-ray without any evidence of acute cardiopulmonary disease.  Since patient did report recent plane travel and having some chest discomfort, I did offer for further work-up to rule out PE including a D-dimer.  I also offered COVID test.  Initial troponin is normal, I have ordered for delta troponin.  Patient however states she does not want any  additional blood work.  She acknowledged the potential risks of PE but states she is not concerned for that and she prefers to go home instead.  She understands the risk and understands to return promptly if she has increased trouble breathing.  Patient is able to make informed decision.  At this time, I felt she is stable to be discharged.  This patient presents to the ED for concern of cold sxs, this involves an extensive number of treatment options, and is a complaint that carries with it a high risk of complications and morbidity.  The differential diagnosis includes covid, flu, tb, pe, chf  Co morbidities that complicate the patient evaluation TB  GERD Additional history obtained:  Additional history obtained from patient External records from outside source obtained and reviewed including EMR  Lab Tests:  I Ordered, and personally interpreted labs.  The pertinent results include:  as above  Imaging Studies ordered:  I ordered imaging studies including CXR I independently visualized and interpreted imaging which showed normal I agree with the radiologist interpretation  Cardiac Monitoring:  The patient was maintained on a cardiac monitor.  I personally viewed and interpreted the cardiac monitored which showed an underlying rhythm of: sinus bradycardia  Medicines ordered and prescription drug management:   Test  Considered: Chest CTA, pt declined  Critical Interventions: none  Consultations Obtained:  I requested consultation with the attending DR. Pickering,  and discussed lab and imaging findings as well as pertinent plan - they recommend: outpt f/u  Problem List / ED Course: cold sxs  Reevaluation:  After the interventions noted above, I reevaluated the patient and found that they have :improved  Social Determinants of Health: none  Dispostion:  After consideration of the diagnostic results and the patients response to treatment, I feel that the patent would benefit from outpt f/u.         Final Clinical Impression(s) / ED Diagnoses Final diagnoses:  Viral illness    Rx / DC Orders ED Discharge Orders          Ordered    benzonatate (TESSALON) 100 MG capsule  3 times daily PRN        08/18/22 1500              Domenic Moras, PA-C 08/18/22 1505    Davonna Belling, MD 08/18/22 1526

## 2022-08-18 NOTE — ED Triage Notes (Addendum)
Pt to ED from home with c/o CP, shortness of breath x6 days. Pt also endorses green sputum, left shoulder pain, and right lower abd pain. Pt describes CP as pressure, no radiation. Pt alert and oriented on arrival to ED, VSS, NAD.

## 2022-08-18 NOTE — Discharge Instructions (Addendum)
You have been evaluated for your symptoms.  Symptoms may be due to a viral infection.  A COVID and flu test have been obtained however the result can be checked through MyChart, link below.  You may take Tessalon as needed for cough.  Please be aware we have not fully evaluate you for a potential blood clot in your lungs that can cause your symptoms.  Our suspicion for that is low but if you develop worsening shortness of breath do not hesitate to return to the ER for further assessment.  Follow-up with your doctor for further care.

## 2022-08-20 ENCOUNTER — Other Ambulatory Visit: Payer: Self-pay

## 2022-08-22 ENCOUNTER — Other Ambulatory Visit: Payer: Self-pay | Admitting: Physician Assistant

## 2022-08-22 DIAGNOSIS — Z8719 Personal history of other diseases of the digestive system: Secondary | ICD-10-CM

## 2022-08-28 ENCOUNTER — Other Ambulatory Visit: Payer: Self-pay | Admitting: Physician Assistant

## 2022-08-28 DIAGNOSIS — Z1231 Encounter for screening mammogram for malignant neoplasm of breast: Secondary | ICD-10-CM

## 2022-08-29 ENCOUNTER — Other Ambulatory Visit: Payer: Self-pay | Admitting: Physician Assistant

## 2022-08-29 ENCOUNTER — Telehealth: Payer: Self-pay | Admitting: Gastroenterology

## 2022-08-29 ENCOUNTER — Ambulatory Visit
Admission: RE | Admit: 2022-08-29 | Discharge: 2022-08-29 | Disposition: A | Discharge status: Home / Self Care | Payer: Medicaid Other | Source: Ambulatory Visit | Attending: Physician Assistant | Admitting: Physician Assistant

## 2022-08-29 DIAGNOSIS — Z8719 Personal history of other diseases of the digestive system: Secondary | ICD-10-CM

## 2022-08-29 NOTE — Telephone Encounter (Signed)
FYI Annette Weiss  ?

## 2022-08-29 NOTE — Telephone Encounter (Signed)
Inbound call from [facility] stating they were only able to get a limited ct scan, after looking further into the order they felt that a complete was necessary but the patient didn't want to wait for a new order to come in. Just wanted to give an [place]

## 2022-08-29 NOTE — Telephone Encounter (Signed)
Noted looks like it was an [place] RUQ that was ordered by Jorge Ny PA

## 2022-10-26 ENCOUNTER — Ambulatory Visit: Payer: No Typology Code available for payment source

## 2022-11-13 ENCOUNTER — Encounter: Payer: Self-pay | Admitting: Physician Assistant

## 2022-11-13 ENCOUNTER — Telehealth: Payer: Self-pay

## 2022-11-13 ENCOUNTER — Ambulatory Visit (INDEPENDENT_AMBULATORY_CARE_PROVIDER_SITE_OTHER): Payer: Medicaid Other | Admitting: Physician Assistant

## 2022-11-13 ENCOUNTER — Other Ambulatory Visit (INDEPENDENT_AMBULATORY_CARE_PROVIDER_SITE_OTHER): Payer: No Typology Code available for payment source

## 2022-11-13 VITALS — BP 122/68 | HR 59 | Ht 61.0 in | Wt 125.0 lb

## 2022-11-13 DIAGNOSIS — K810 Acute cholecystitis: Secondary | ICD-10-CM | POA: Diagnosis not present

## 2022-11-13 DIAGNOSIS — K8020 Calculus of gallbladder without cholecystitis without obstruction: Secondary | ICD-10-CM

## 2022-11-13 DIAGNOSIS — G8929 Other chronic pain: Secondary | ICD-10-CM

## 2022-11-13 DIAGNOSIS — R1013 Epigastric pain: Secondary | ICD-10-CM | POA: Diagnosis not present

## 2022-11-13 DIAGNOSIS — R112 Nausea with vomiting, unspecified: Secondary | ICD-10-CM

## 2022-11-13 DIAGNOSIS — R634 Abnormal weight loss: Secondary | ICD-10-CM | POA: Diagnosis not present

## 2022-11-13 DIAGNOSIS — K8050 Calculus of bile duct without cholangitis or cholecystitis without obstruction: Secondary | ICD-10-CM

## 2022-11-13 LAB — COMPREHENSIVE METABOLIC PANEL
ALT: 14 U/L (ref 0–35)
AST: 19 U/L (ref 0–37)
Albumin: 4.2 g/dL (ref 3.5–5.2)
Alkaline Phosphatase: 75 U/L (ref 39–117)
BUN: 12 mg/dL (ref 6–23)
CO2: 28 mEq/L (ref 19–32)
Calcium: 10.4 mg/dL (ref 8.4–10.5)
Chloride: 106 mEq/L (ref 96–112)
Creatinine, Ser: 0.65 mg/dL (ref 0.40–1.20)
GFR: 87.08 mL/min (ref >60.00)
Glucose, Bld: 97 mg/dL (ref 70–99)
Potassium: 4.9 mEq/L (ref 3.5–5.1)
Sodium: 142 mEq/L (ref 135–145)
Total Bilirubin: 0.5 mg/dL (ref 0.2–1.2)
Total Protein: 8.5 g/dL — ABNORMAL HIGH (ref 6.0–8.3)

## 2022-11-13 LAB — CBC WITH DIFFERENTIAL/PLATELET
Basophils Absolute: 0.1 10*3/uL (ref 0.0–0.1)
Basophils Relative: 1.1 % (ref 0.0–3.0)
Eosinophils Absolute: 0.4 10*3/uL (ref 0.0–0.7)
Eosinophils Relative: 7.4 % — ABNORMAL HIGH (ref 0.0–5.0)
HCT: 38.9 % (ref 36.0–46.0)
Hemoglobin: 12.9 g/dL (ref 12.0–15.0)
Lymphocytes Relative: 36.8 % (ref 12.0–46.0)
Lymphs Abs: 2 10*3/uL (ref 0.7–4.0)
MCHC: 33.2 g/dL (ref 30.0–36.0)
MCV: 88.3 fl (ref 78.0–100.0)
Monocytes Absolute: 0.5 10*3/uL (ref 0.1–1.0)
Monocytes Relative: 8.6 % (ref 3.0–12.0)
Neutro Abs: 2.5 10*3/uL (ref 1.4–7.7)
Neutrophils Relative %: 46.1 % (ref 43.0–77.0)
Platelets: 191 10*3/uL (ref 150.0–400.0)
RBC: 4.41 Mil/uL (ref 3.87–5.11)
RDW: 13.1 % (ref 11.5–15.5)
WBC: 5.3 10*3/uL (ref 4.0–10.5)

## 2022-11-13 NOTE — Telephone Encounter (Signed)
Records have been faxed waiting appointment information.

## 2022-11-13 NOTE — Progress Notes (Signed)
Subjective:    Patient ID: Annette Weiss, female    DOB: 1951-07-17, 71 y.o.   MRN: [id number]  HPI  Annette Weiss is a pleasant 71 year old [place] speaking white female from [place], established with Dr. Candis Schatz.  She was last seen here by Alonza Bogus in June 2023.  She comes in today with complaints of continued weight loss and inability to eat. He was initially seen here by myself in May 2023 with complaints of chronic GERD and at that time had expressed a gradual weight loss of about 24 pounds over the past 8 or 9 months.  She had some symptoms of early satiety but no pain nausea or vomiting.  She had related that she had had EGD done in [place] 3 to 4 years previous but was not given any specific diagnosis.  We stopped her omeprazole and switched her to Aciphex 20 mg daily, started an antireflux regimen, discussed EGD which she was reluctant to do and then proceeded with CT of the abdomen and pelvis. CT with contrast showed cholelithiasis without findings of cholecystitis, no ductal dilation, normal-appearing pancreas, and moderate formed stool throughout the colon. She came back in in June 2023 and saw Alonza Bogus at that time more with complaints of chronic constipation but did not want to proceed with follow-up colonoscopy, with complaints of GERD she was agreeable to an upper GI-this was scheduled but she did not follow through with that. She says she had been in [place] to visit family this summer and was having some intermittent episodes of abdominal pain nausea and vomiting and this has persisted over the past few months.  She was seen by primary care and had upper abdominal ultrasound done in October 2023 that did show multiple gallstones in the gallbladder no gallbladder wall thickening or pericholecystic fluid and a common bile duct of 1.7 mm there were some echogenic foci in the liver corresponding to calcified granuloma on prior CT and mild hepatic steatosis.  Patient relates that she has  continued to lose weight and indeed has lost 10 pounds since she was seen here in June 2023.  She says she is only eating very small amounts of food and very limited foods primarily living on bread mozzarella cheese and tea currently because she feels most other foods exacerbate her symptoms and she is trying to avoid the abdominal pain nausea and vomiting. She relates episodes of severe epigastric pain that radiates straight through to her back that occurs after eating, she says sometimes she gets a headache with this and her blood pressure is elevated because of the pain the symptoms may last 20 to 30 minutes then resolve she usually has nausea and vomiting associated.  She may be having a couple of episodes per month currently the last 1 was about 4 days ago. She is taking some unspecified antispasmodic "" medication from [place] and she gets this pain and thinks perhaps it helps a little bit.  This was apparently a nonprescription medication.  She last had labs in October 2023 with normal c-Met and CBC  GERD symptoms are under control with Aciphex 20 mg daily   Review of Systems Pertinent positive and negative review of systems were noted in the above HPI section.  All other review of systems was otherwise negative.   Outpatient Encounter Medications as of 11/13/2022  Medication Sig   benzonatate (TESSALON) 100 MG capsule Take 1 capsule (100 mg total) by mouth 3 (three) times daily as needed for cough. (Patient not  taking: Reported on 11/13/2022)   enalapril (VASOTEC) 5 MG tablet Take 1 tablet (5 mg total) by mouth daily. (Patient not taking: Reported on 11/13/2022)   RABEprazole (ACIPHEX) 20 MG tablet Take 1 tablet (20 mg total) by mouth daily. (Patient not taking: Reported on 11/13/2022)   No facility-administered encounter medications on file as of 11/13/2022.   Allergies  Allergen Reactions   Statins Other (See Comments)   Patient Active Problem List   Diagnosis Date Noted    Cholelithiasis 11/13/2022   Gastroesophageal reflux disease 05/15/2022   LUQ abdominal pain 05/15/2022   Constipation 05/15/2022   Loss of weight 05/15/2022   Essential hypertension 07/21/2021   Influenza vaccine refused 01/18/2021   COVID-19 vaccine dose declined 01/18/2021   Tetanus, diphtheria, and acellular pertussis (Tdap) vaccination declined 01/18/2021   Social History   Socioeconomic History   Marital status: Married    Spouse name: Not on file   Number of children: Not on file   Years of education: Not on file   Highest education level: Not on file  Occupational History   Not on file  Tobacco Use   Smoking status: Never   Smokeless tobacco: Never  Vaping Use   Vaping Use: Never used  Substance and Sexual Activity   Alcohol use: Never   Drug use: Never   Sexual activity: Yes  Other Topics Concern   Not on file  Social History Narrative   Not on file   Social Determinants of Health   Financial Resource Strain: Not on file  Food Insecurity: Not on file  Transportation Needs: Not on file  Physical Activity: Not on file  Stress: Not on file  Social Connections: Not on file  Intimate Partner Violence: Not on file    Ms. Summerson's family history includes Heart disease in her mother; Hypertension in her mother; Stroke in her father.      Objective:    Vitals:   11/13/22 0830  BP: 122/68  Pulse: (!) 59    Physical Exam Well-developed well-nourished older female in no acute distress.  Accompanied by interpreter height, Weight, 125 BMI 23.62  HEENT; nontraumatic normocephalic, EOMI, PE R LA, sclera anicteric. Oropharynx; not examined today Neck; supple, no JVD Cardiovascular; regular rate and rhythm with S1-S2, no murmur rub or gallop Pulmonary; Clear bilaterally Abdomen; soft, nontender, nondistended, no palpable mass or hepatosplenomegaly, bowel sounds are active Rectal; not done today Skin; benign exam, no jaundice rash or appreciable  lesions Extremities; no clubbing cyanosis or edema skin warm and dry Neuro/Psych; alert and oriented x4, grossly nonfocal mood and affect appropriate        Assessment & Plan:   #73 71 year old female from [place], and understands and speaks some [place].  Accompanied by interpreter today Patient has had a total weight loss of about 30 pounds over the past year.  She has been self restricting her diet over the past year fairly severely to try to avoid recurrent episodes of epigastric pain which she says is severe, radiates to her back and is associated with nausea and vomiting.  Multiple foods seem to aggravate the symptoms. Currently with restricted diet having 2-3 episodes per month, the last 1 was about 4 days ago.  She has had previously documented cholelithiasis on both CT scan and an ultrasound October 2023 Exam is benign today  Think her symptoms are very consistent with biliary colic, and I believe her weight loss is secondary to self-imposed severe dietary restriction and efforts to  avoid these episodes of severe pain.  #2 history of chronic GERD-stable on Aciphex 20 mg p.o. daily #3 hypertension #4 colon cancer screening-apparently negative colonoscopy in [place] about 10 years ago has not wanted to have follow-up  Plan; We discussed referral to surgery for laparoscopic cholecystectomy and at this time she is agreeable. Continue bland low-fat diet CBC with differential and c-Met today Continue Aciphex 20 mg p.o. every morning AC breakfast and antireflux regimen.  Dwon Sky Genia Harold PA-C 11/13/2022   Cc: Gildardo Pounds, NP

## 2022-11-13 NOTE — Patient Instructions (Signed)
_______________________________________________________  If you are age 73 or older, your body mass index should be between 23-30. Your Body mass index is 23.62 kg/m. If this is out of the aforementioned range listed, please consider follow up with your Primary Care Provider.  If you are age 36 or younger, your body mass index should be between 19-25. Your Body mass index is 23.62 kg/m. If this is out of the aformentioned range listed, please consider follow up with your Primary Care Provider.   ________________________________________________________  The [place] [facility] providers would like to encourage you to use [facility] to communicate with providers for non-urgent requests or questions.  Due to long hold times on the telephone, sending your provider a message by [facility] may be a faster and more efficient way to get a response.  Please allow 48 business hours for a response.  Please remember that this is for non-urgent requests.  _______________________________________________________  Your provider has requested that you go to the basement level for lab work before leaving today. Press "B" on the elevator. The lab is located at the first door on the left as you exit the elevator.  We will send your records to [facility]. They will reach out to you to schedule.  [facility] is located at [address]. Should you need to reach out to them, please contact them at [phone].  Due to recent changes in healthcare laws, you may see the results of your imaging and laboratory studies on MyChart before your provider has had a chance to review them.  We understand that in some cases there may be results that are confusing or concerning to you. Not all laboratory results come back in the same time frame and the provider may be waiting for multiple results in order to interpret others.  Please give [place] 48 hours in order for your provider to thoroughly review all  the results before contacting the office for clarification of your results.     Thank you for trusting me with your gastrointestinal care!   Alcide Evener, CRNP

## 2022-11-15 NOTE — Progress Notes (Signed)
Agree with the assessment and plan as outlined by Amy Esterwood, PA-C.  Kriss Ishler E. Makyiah Lie, MD  

## 2022-11-26 ENCOUNTER — Telehealth: Payer: Self-pay | Admitting: Physician Assistant

## 2022-11-26 NOTE — Telephone Encounter (Signed)
PT is calling to get a referral to a place that can remove her gallbladder with medicaid. She said that [facility] wont take her insurance.Please advise.

## 2022-11-27 NOTE — Telephone Encounter (Signed)
Appointment scheduled with [facility] for 12/19/22 at 10:00 am. Patient notified. She is given the telephone number for the surgeon's office to call with any questions [phone]. Records faxed to [phone]. Patient is instructed to complete the questionnaire that she will receive in the mail from the surgeon's office with her appointment information. Take it to her appointment.

## 2023-01-25 NOTE — Progress Notes (Unsigned)
Cardiology Office Note:    Date:  01/25/2023   ID:  Annette Weiss, DOB 07/29/1951, MRN [id number]  PCP:  Gildardo Pounds, NP   [facility] Providers Cardiologist:  None { Click to update primary MD,subspecialty MD or APP then REFRESH:1}    Referring MD: Gildardo Pounds, NP   Chief Complaint: ***  History of Present Illness:    Annette Weiss is a *** 72 y.o. female with a hx of HTN, HLD, GERD, palpitations  She was referred to cardiology and seen by Dr. Johney Frame on 02/13/2022 for evaluation of palpitations. She reported nocturnal hypertensive episodes associated with palpitations. This wakes her up in the middle of the night.  During the episodes her BP was reportedly 150-160s/80-90s, she had been taking 25 mg captopril as needed when these episodes occur.  This usually normalized her blood pressure to 130s/80s in about 30 minutes.  At recent visit with PCP she was taking amlodipine 5 mg daily but wished to restart enalapril which she had taken previously. She was referred to cardiology for palpitations and consideration of event monitor.  At ov 02/13/22, she reported irregular and pounding palpitations since starting amlodipine.  Also had palpitations prior to starting amlodipine but they were not as severe.  Her palpitations initially started about 6 months prior and occurred only at night.  The harder, pounding palpitations have an onset of about 2 to 4 months prior also during the night.  Her palpitations improved when amlodipine was discontinued.  She was sleeping better through the night.  BP had been running 120s/80s.  History of persistently elevated cholesterol despite dietary efforts.  Has not tolerated statins in the past.  Cardiac monitor was discussed but she declined given improvement of symptoms. She was advised to avoid caffeine, particularly in the evenings and to take magnesium at bedtime. LDL was 181. She declined starting Zetia and declined referral to lipid clinic, said  she may consider in the future. Healthy lifestyle emphasized. 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 Recommendation for yearly CTA for measurement of aorta and repeat echo in 1-2 years, sooner if clinically indicated. Advised to maintain good BP control.  Today, she is here  Aneurysm education  Past Medical History:  Diagnosis Date   Anemia    Gallstones    Hyperlipidemia    Hypertension    Thyroid disease     Past Surgical History:  Procedure Laterality Date   EAR PINNA RECONSTRUCTION W/ RIB GRAFT     TONSILLECTOMY      Current Medications: No outpatient medications have been marked as taking for the 01/28/23 encounter (Appointment) with Ann Maki, Lanice Schwab, NP.     Allergies:   Statins   Social History   Socioeconomic History   Marital status: Married    Spouse name: Not on file   Number of children: Not on file   Years of education: Not on file   Highest education level: Not on file  Occupational History   Not on file  Tobacco Use   Smoking status: Never   Smokeless tobacco: Never  Vaping Use   Vaping Use: Never used  Substance and Sexual Activity   Alcohol use: Never   Drug use: Never   Sexual activity: Yes  Other Topics Concern   Not on file  Social History Narrative  Not on file   Social Determinants of Health   Financial Resource Strain: Not on file  Food Insecurity: Not on file  Transportation Needs: Not on file  Physical Activity: Not on file  Stress: Not on file  Social Connections: Not on file     Family History: The patient's ***family history includes Heart disease in her mother; Hypertension in her mother; Stroke in her father. There is no history of Stomach cancer, Colon cancer, or Esophageal cancer.  ROS:   Please see the history of present illness.    *** All  other systems reviewed and are negative.  Labs/Other Studies Reviewed:    The following studies were reviewed today:  Echo 02/26/22 1. Left ventricular ejection fraction, by estimation, is 60 to 65%. The  left ventricle has normal function. The left ventricle has no regional  wall motion abnormalities. Left ventricular diastolic parameters are  indeterminate. The average left  ventricular global longitudinal strain is -24.0 %. The global longitudinal  strain is normal.   2. Right ventricular systolic function is normal. The right ventricular  size is normal. There is normal pulmonary artery systolic pressure.   3. Left atrial size was mild to moderately dilated.   4. The mitral valve is degenerative. Mild mitral valve regurgitation.   5. Tricuspid valve regurgitation is mild to moderate.   6. The aortic valve is tricuspid. Aortic valve regurgitation is moderate.  Aortic valve sclerosis is present, with no evidence of aortic valve  stenosis.   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   8. The inferior vena cava is normal in size with greater than 50%  respiratory variability, suggesting right atrial pressure of 3 mmHg.   Comparison(s): No prior Echocardiogram. Recent Labs: 11/13/2022: ALT 14; BUN 12; Creatinine, Ser 0.65; Hemoglobin 12.9; Platelets 191.0; Potassium 4.9; Sodium 142  Recent Lipid Panel    Component Value Date/Time   CHOL 270 (H) 01/18/2021 0950   TRIG 108 01/18/2021 0950   HDL 70 01/18/2021 0950   CHOLHDL 3.9 01/18/2021 0950   LDLCALC 181 (H) 01/18/2021 0950     Risk Assessment/Calculations:   {Does this patient have ATRIAL FIBRILLATION?:[phone]}       Physical Exam:    VS:  There were no vitals taken for this visit.    Wt Readings from Last 3 Encounters:  11/13/22 125 lb (56.7 kg)  08/18/22 134 lb 14.7 oz (61.2 kg)  05/15/22 135 lb (61.2 kg)     GEN: *** Well nourished, well developed in no acute distress HEENT: Normal NECK: No JVD; No  carotid bruits CARDIAC: ***RRR, no murmurs, rubs, gallops RESPIRATORY:  Clear to auscultation without rales, wheezing or rhonchi  ABDOMEN: Soft, non-tender, non-distended MUSCULOSKELETAL:  No edema; No deformity. *** pedal pulses, ***bilaterally SKIN: Warm and dry NEUROLOGIC:  Alert and oriented x 3 PSYCHIATRIC:  Normal affect   EKG:  EKG is *** ordered today.  The ekg ordered today demonstrates ***  No BP recorded.  {Refresh Note OR Click here to enter BP  :1}***    Diagnoses:    No diagnosis found. 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 Recommendation to undergo CTA aorta.  Hyperlipidemia: LDL 133 on 01/16/23  {Are you ordering a CV Procedure (e.g. stress test, cath, DCCV, TEE, etc)?   Press F2        :  [id number]   Disposition:  Medication Adjustments/Labs and Tests Ordered: Current medicines are reviewed at length with the patient today.  Concerns regarding medicines are outlined above.  No orders of the defined types were placed in this encounter.  No orders of the defined types were placed in this encounter.   There are no Patient Instructions on file for this visit.   Signed, Emmaline Life, NP  01/25/2023 4:05 PM    [place]

## 2023-01-28 ENCOUNTER — Ambulatory Visit: Payer: Medicaid Other | Attending: Nurse Practitioner | Admitting: Nurse Practitioner

## 2023-01-28 ENCOUNTER — Encounter: Payer: Self-pay | Admitting: Nurse Practitioner

## 2023-01-28 VITALS — BP 102/60 | HR 62 | Ht 60.0 in | Wt 126.6 lb

## 2023-01-28 DIAGNOSIS — I7121 Aneurysm of the ascending aorta, without rupture: Secondary | ICD-10-CM | POA: Diagnosis not present

## 2023-01-28 DIAGNOSIS — R002 Palpitations: Secondary | ICD-10-CM | POA: Diagnosis not present

## 2023-01-28 DIAGNOSIS — I10 Essential (primary) hypertension: Secondary | ICD-10-CM

## 2023-01-28 DIAGNOSIS — I38 Endocarditis, valve unspecified: Secondary | ICD-10-CM | POA: Diagnosis not present

## 2023-01-28 DIAGNOSIS — E785 Hyperlipidemia, unspecified: Secondary | ICD-10-CM

## 2023-01-28 MED ORDER — ENALAPRIL MALEATE 5 MG PO TABS: 7.5000 mg | ORAL_TABLET | Freq: Every day | ORAL | 0 refills | Status: DC

## 2023-01-28 NOTE — Patient Instructions (Addendum)
Medication Instructions:   INCREASE Enalapril one and one half (1.5) tablet by mouth  (7.5 mg) daily.  *If you need a refill on your cardiac medications before your next appointment, please call your pharmacy*   Lab Work:  TODAY!!! BMET   If you have labs (blood work) drawn today and your tests are completely normal, you will receive your results only by: [place] (if you have MyChart) OR A paper copy in the mail If you have any lab test that is abnormal or we need to change your treatment, we will call you to review the results.   Testing/Procedures:  Non-Cardiac CT Angiography (CTA), is a special type of CT scan that uses a computer to produce multi-dimensional views of major blood vessels throughout the body. In CT angiography, a contrast material is injected through an IV to help visualize the blood vessels    Follow-Up: At [facility], you and your health needs are our priority.  As part of our continuing mission to provide you with exceptional heart care, we have created designated Provider Care Teams.  These Care Teams include your primary Cardiologist (physician) and Advanced Practice Providers (APPs -  Physician Assistants and Nurse Practitioners) who all work together to provide you with the care you need, when you need it.  We recommend signing up for the patient portal called "MyChart".  Sign up information is provided on this After Visit Summary.  MyChart is used to connect with patients for Virtual Visits (Telemedicine).  Patients are able to view lab/test results, encounter notes, upcoming appointments, etc.  Non-urgent messages can be sent to your provider as well.   To learn more about what you can do with MyChart, go to [email or web address].    Your next appointment:   5 month(s)  Provider:   Gwyndolyn Kaufman, MD      One of your tests has shown an aneurysm of your aortic root. The word "aneurysm" refers to a bulge in an artery (blood  vessel). Most people think of them in the context of an emergency, but yours was found incidentally. At this point there is nothing you need to do from a procedure standpoint, but there are some important things to keep in mind for day-to-day life.  Mainstays of therapy for aneurysms include very good blood pressure control, healthy lifestyle, and avoiding tobacco products and street drugs. Research has raised concern that antibiotics in the fluoroquinolone class could be associated with increased risk of having an aneurysm develop or tear. This includes medicines that end in "floxacin," like Cipro or Levaquin. Make sure to discuss this information with other healthcare providers if you require antibiotics.  Since aneurysms can run in families, you should discuss your diagnosis with first degree relatives as they may need to be screened for this. Regular mild-moderate physical exercise is important, but avoid heavy lifting/weight lifting over 30lbs, chopping wood, shoveling snow or digging heavy earth with a shovel. It is best to avoid activities that cause grunting or straining (medically referred to as a "Valsalva maneuver"). This happens when a person bears down against a closed throat to increase the strength of arm or abdominal muscles. There's often a tendency to do this when lifting heavy weights, doing sit-ups, push-ups or chin-ups, etc., but it may be harmful.  This is a finding I would expect to be monitored periodically by your cardiology team. Most unruptured thoracic aortic aneurysms cause no symptoms, so they are often found during exams for other conditions. Contact  a health care provider if you develop any discomfort in your upper back, neck, abdomen, trouble swallowing, cough or hoarseness, or unexplained weight loss. Get help right away if you develop severe pain in your upper back or abdomen that may move into your chest and arms, or any other concerning symptoms such as shortness of breath  or fever.

## 2023-01-29 LAB — BASIC METABOLIC PANEL
BUN/Creatinine Ratio: 24 (ref 12–28)
BUN: 15 mg/dL (ref 8–27)
CO2: 23 mmol/L (ref 20–29)
Calcium: 9.5 mg/dL (ref 8.7–10.3)
Chloride: 105 mmol/L (ref 96–106)
Creatinine, Ser: 0.62 mg/dL (ref 0.57–1.00)
Glucose: 86 mg/dL (ref 70–99)
Potassium: 4.2 mmol/L (ref 3.5–5.2)
Sodium: 141 mmol/L (ref 134–144)
eGFR: 93 mL/min/{1.73_m2} (ref >59)

## 2023-01-29 NOTE — Progress Notes (Signed)
Pt has been made aware of normal result and verbalized understanding.  jw

## 2023-02-19 ENCOUNTER — Encounter: Payer: Self-pay | Admitting: Cardiology

## 2023-03-01 ENCOUNTER — Encounter: Payer: Self-pay | Admitting: Nurse Practitioner

## 2023-03-06 ENCOUNTER — Encounter: Payer: Self-pay | Admitting: Gastroenterology

## 2023-03-06 ENCOUNTER — Ambulatory Visit (INDEPENDENT_AMBULATORY_CARE_PROVIDER_SITE_OTHER): Payer: Medicaid Other | Admitting: Gastroenterology

## 2023-03-06 ENCOUNTER — Other Ambulatory Visit (INDEPENDENT_AMBULATORY_CARE_PROVIDER_SITE_OTHER): Payer: Medicaid Other

## 2023-03-06 VITALS — BP 120/72 | HR 68 | Ht 60.0 in | Wt 123.0 lb

## 2023-03-06 DIAGNOSIS — Z1211 Encounter for screening for malignant neoplasm of colon: Secondary | ICD-10-CM | POA: Diagnosis not present

## 2023-03-06 DIAGNOSIS — R110 Nausea: Secondary | ICD-10-CM

## 2023-03-06 DIAGNOSIS — Z8619 Personal history of other infectious and parasitic diseases: Secondary | ICD-10-CM

## 2023-03-06 DIAGNOSIS — K760 Fatty (change of) liver, not elsewhere classified: Secondary | ICD-10-CM

## 2023-03-06 LAB — COMPREHENSIVE METABOLIC PANEL
ALT: 13 U/L (ref 0–35)
AST: 17 U/L (ref 0–37)
Albumin: 4.1 g/dL (ref 3.5–5.2)
Alkaline Phosphatase: 80 U/L (ref 39–117)
BUN: 15 mg/dL (ref 6–23)
CO2: 28 mEq/L (ref 19–32)
Calcium: 9.8 mg/dL (ref 8.4–10.5)
Chloride: 104 mEq/L (ref 96–112)
Creatinine, Ser: 0.55 mg/dL (ref 0.40–1.20)
GFR: 90.46 mL/min (ref >60.00)
Glucose, Bld: 88 mg/dL (ref 70–99)
Potassium: 4.6 mEq/L (ref 3.5–5.1)
Sodium: 139 mEq/L (ref 135–145)
Total Bilirubin: 0.5 mg/dL (ref 0.2–1.2)
Total Protein: 7.8 g/dL (ref 6.0–8.3)

## 2023-03-06 LAB — LIPASE: Lipase: 38 U/L (ref 11.0–59.0)

## 2023-03-06 NOTE — Patient Instructions (Signed)
Your provider has requested that you go to the basement level for lab work before leaving today. Press "B" on the elevator. The lab is located at the first door on the left as you exit the elevator.  You have been scheduled for an elastography at [facility] (1st floor of hospital) on 03/14/23 at 9:30am. Please arrive 30 minutes prior to your appointment for registration. Make certain not to have anything to eat or drink 6 hours prior to your appointment. Should you need to reschedule your appointment, please contact radiology at [phone]. This test typically takes about 30 minutes to perform.  The [place] [facility] providers would like to encourage you to use [facility] to communicate with providers for non-urgent requests or questions.  Due to long hold times on the telephone, sending your provider a message by [facility] may be a faster and more efficient way to get a response.  Please allow 48 business hours for a response.  Please remember that this is for non-urgent requests.   Due to recent changes in healthcare laws, you may see the results of your imaging and laboratory studies on MyChart before your provider has had a chance to review them.  We understand that in some cases there may be results that are confusing or concerning to you. Not all laboratory results come back in the same time frame and the provider may be waiting for multiple results in order to interpret others.  Please give [place] 48 hours in order for your provider to thoroughly review all the results before contacting the office for clarification of your results.   Thank you for choosing me and [facility].  Tiajuana Amass, MD

## 2023-03-06 NOTE — Progress Notes (Signed)
HPI : Annette Weiss is a very pleasant 72 year old female with a history of hypertension and hyperlipidemia who was previously been evaluated in our office for chronic symptoms of abdominal pain and weight loss.  She primarily speaks [place], and so is accompanied by a medical interpreter in clinic today.  Her symptoms were thought to be attributable to gallstones, and she subsequently underwent a cholecystectomy March 14. Today, she reports that she has significant improvement in her abdominal pain, but still has occasional mild discomfort.  She also continues to have frequent nausea and itching.  She is concerned about the health of her liver and her pancreas following her cholecystectomy.  She denies any symptoms of jaundice or icterus.  No dark urine or clay colored stools.  Her liver enzymes were last checked in December and were unremarkable.  She tells me that she has been diagnosed with H. pylori in the past and treated while in [place].  She thinks that she was treated multiple times, but does not know which antibiotics were used. She reports a longstanding history of fatty liver, which she was diagnosed with many years ago in [place].  She has no family history of liver disease.  She does not drink alcohol. Her weight has been stable to increasing since her surgery.  She follows a low-fat diet.  Although she feels nauseated, she does not vomit.  She has chronic constipation, which she is able to alleviate with taking prune juice as needed.  Her stools are often small and hard.  She reports having a colonoscopy many years ago in [place] which she believes was normal.  No family history of colon cancer.  RUQUS:  Aug 30, 2022 IMPRESSION: 1. Cholelithiasis without acute cholecystitis. 2. Mild hepatic steatosis   CT abdomen/pelvis:  Apr 18, 2022 IMPRESSION: 1. No acute abdominopelvic findings. 2. Cholelithiasis without findings of acute cholecystitis. 3. Moderate volume of formed stool  throughout the colon. Correlate for constipation. 4.  Aortic Atherosclerosis (ICD10-I70.0)  Past Medical History:  Diagnosis Date   Anemia    Gallstones    Hyperlipidemia    Hypertension    Thyroid disease      Past Surgical History:  Procedure Laterality Date   EAR PINNA RECONSTRUCTION W/ RIB GRAFT     TONSILLECTOMY     Family History  Problem Relation Age of Onset   Hypertension Mother    Heart disease Mother    Stroke Father    Stomach cancer Neg Hx    Colon cancer Neg Hx    Esophageal cancer Neg Hx    Social History   Tobacco Use   Smoking status: Never   Smokeless tobacco: Never  Vaping Use   Vaping Use: Never used  Substance Use Topics   Alcohol use: Never   Drug use: Never   Current Outpatient Medications  Medication Sig Dispense Refill   fenofibrate (TRICOR) 48 MG tablet Take by mouth.     benzonatate (TESSALON) 100 MG capsule Take 1 capsule (100 mg total) by mouth 3 (three) times daily as needed for cough. (Patient not taking: Reported on 01/28/2023) 21 capsule 0   enalapril (VASOTEC) 5 MG tablet Take 1.5 tablets (7.5 mg total) by mouth daily. 90 tablet 0   RABEprazole (ACIPHEX) 20 MG tablet Take 1 tablet (20 mg total) by mouth daily. (Patient not taking: Reported on 01/28/2023) 30 tablet 3   No current facility-administered medications for this visit.   Allergies  Allergen Reactions   Statins  Other (See Comments)     Review of Systems: All systems reviewed and negative except where noted in HPI.    No results found.  Physical Exam: BP 120/72   Pulse 68   Ht 5' (1.524 m)   Wt 123 lb (55.8 kg)   BMI 24.02 kg/m  Constitutional: Pleasant,well-developed, Caucasian female in no acute distress.  Accompanied by medical interpreter HEENT: Normocephalic and atraumatic. Conjunctivae are normal. No scleral icterus. Neck supple.  Cardiovascular: Normal rate, regular rhythm.  Pulmonary/chest: Effort normal and breath sounds normal. No wheezing, rales  or rhonchi. Abdominal: Soft, nondistended, nontender. Bowel sounds active throughout. There are no masses palpable. No hepatomegaly. Extremities: no edema Neurological: Alert and oriented to person place and time. Skin: Skin is warm and dry. No rashes noted. Psychiatric: Normal mood and affect. Behavior is normal.  CBC    Component Value Date/Time   WBC 5.3 11/13/2022 0915   RBC 4.41 11/13/2022 0915   HGB 12.9 11/13/2022 0915   HGB 13.1 12/28/2021 1552   HCT 38.9 11/13/2022 0915   HCT 40.6 12/28/2021 1552   PLT 191.0 11/13/2022 0915   PLT 184 12/28/2021 1552   MCV 88.3 11/13/2022 0915   MCV 88 12/28/2021 1552   MCH 29.1 08/18/2022 1132   MCHC 33.2 11/13/2022 0915   RDW 13.1 11/13/2022 0915   RDW 11.8 12/28/2021 1552   LYMPHSABS 2.0 11/13/2022 0915   LYMPHSABS 2.4 12/28/2021 1552   MONOABS 0.5 11/13/2022 0915   EOSABS 0.4 11/13/2022 0915   EOSABS 0.3 12/28/2021 1552   BASOSABS 0.1 11/13/2022 0915   BASOSABS 0.1 12/28/2021 1552    CMP     Component Value Date/Time   NA 141 01/28/2023 1543   K 4.2 01/28/2023 1543   CL 105 01/28/2023 1543   CO2 23 01/28/2023 1543   GLUCOSE 86 01/28/2023 1543   GLUCOSE 97 11/13/2022 0915   BUN 15 01/28/2023 1543   CREATININE 0.62 01/28/2023 1543   CALCIUM 9.5 01/28/2023 1543   PROT 8.5 (H) 11/13/2022 0915   PROT 7.5 12/28/2021 1552   ALBUMIN 4.2 11/13/2022 0915   ALBUMIN 4.4 12/28/2021 1552   AST 19 11/13/2022 0915   ALT 14 11/13/2022 0915   ALKPHOS 75 11/13/2022 0915   BILITOT 0.5 11/13/2022 0915   BILITOT 0.2 12/28/2021 1552   GFRNONAA >60 08/18/2022 1132     ASSESSMENT AND PLAN:  72 year old female with chronic abdominal pain and weight loss attributed to symptomatic cholelithiasis, now status postcholecystectomy 1 month ago.  Her symptoms are much improved, but she continues to have issues with nausea and itching, and she is worried about her liver health.  She states that she was diagnosed with fatty liver many years ago.   A right upper quadrant ultrasound in October 2023 showed evidence of steatosis, without any evidence of cirrhosis.  Her liver enzymes have been normal.  I reassured the patient that I had no significant concerns about her liver health based on her labs and imaging, but agreed to recheck liver enzymes as well as lipase levels given her concerns.  I reviewed the anatomy and physiology involved with the gallbladder and its function, and the lack of significance and its role in liver function. Given the patient's report of having longstanding fatty liver disease, I did agree to proceed with elastography to assess for significant scarring/fibrosis.  As the patient does not have typical risk factors for MASLD and does not drink alcohol, I would recommend we proceed with full evaluation  for causes of chronic liver disease if any significant fibrosis is found. We discussed colon cancer screening, and the patient initially agreed to proceed with a colonoscopy, but then subsequently changed her mind.  She then agreed to proceed with a stool based colon cancer screening test. 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  This may be contributing to her chronic nausea.  I recommended proceeding with an upper endoscopy to assess for H. pylori and also evaluate for stomach cancer or precancerous changes, given the relationship between H. pylori and stomach cancer.  However, the patient was resistant to an endoscopy, citing concerns about possible complications.  I will recheck an H. pylori stool antigen, which I anticipate will be positive.  It would be difficult to select an antibiotic regimen not knowing what she has previously been treated with, or even have any times.    Nausea/hx H. Pylori - H. Pylori stool antigen  Fatty liver - Elastography - CMP, lipase  CRC screening -FIT  Avan Gullett E. Tomasa Rand, MD [facility]   CC:  Claiborne Rigg, NP

## 2023-03-08 NOTE — Progress Notes (Signed)
Ms. Annette Weiss,  Your liver enzymes and pancreas test were normal.  There is no need to be concerned about any significant liver or pancreas dysfunction.

## 2023-03-11 ENCOUNTER — Other Ambulatory Visit (INDEPENDENT_AMBULATORY_CARE_PROVIDER_SITE_OTHER): Payer: Medicaid Other

## 2023-03-11 DIAGNOSIS — Z1211 Encounter for screening for malignant neoplasm of colon: Secondary | ICD-10-CM

## 2023-03-11 LAB — FECAL OCCULT BLOOD, IMMUNOCHEMICAL: Fecal Occult Bld: POSITIVE — AB

## 2023-03-13 ENCOUNTER — Ambulatory Visit (HOSPITAL_COMMUNITY)
Admission: RE | Admit: 2023-03-13 | Discharge: 2023-03-13 | Disposition: A | Discharge status: Home / Self Care | Payer: Medicaid Other | Source: Ambulatory Visit | Attending: Nurse Practitioner | Admitting: Nurse Practitioner

## 2023-03-13 DIAGNOSIS — I7121 Aneurysm of the ascending aorta, without rupture: Secondary | ICD-10-CM | POA: Insufficient documentation

## 2023-03-13 DIAGNOSIS — I10 Essential (primary) hypertension: Secondary | ICD-10-CM | POA: Insufficient documentation

## 2023-03-13 MED ORDER — IOHEXOL 350 MG/ML SOLN: 100.0000 mL | Freq: Once | INTRAVENOUS | Status: DC | PRN

## 2023-03-13 MED ORDER — SODIUM CHLORIDE (PF) 0.9 % IJ SOLN
INTRAMUSCULAR | Status: AC
  Filled 2023-03-13: qty 50

## 2023-03-13 MED ORDER — IOHEXOL 350 MG/ML SOLN
100.0000 mL | Freq: Once | INTRAVENOUS | Status: AC | PRN
  Administered 2023-03-13: 100 mL via INTRAVENOUS

## 2023-03-14 ENCOUNTER — Ambulatory Visit (HOSPITAL_COMMUNITY)
Admission: RE | Admit: 2023-03-14 | Discharge: 2023-03-14 | Disposition: A | Discharge status: Home / Self Care | Payer: Medicaid Other | Source: Ambulatory Visit | Attending: Gastroenterology | Admitting: Gastroenterology

## 2023-03-14 DIAGNOSIS — K760 Fatty (change of) liver, not elsewhere classified: Secondary | ICD-10-CM | POA: Insufficient documentation

## 2023-03-14 NOTE — Progress Notes (Signed)
Ms. Annette Weiss, Your elastography showed that you have no significant scarring or stiffness in your liver.  The likelihood of you having significant liver disease in your lifetime is very low.

## 2023-03-14 NOTE — Progress Notes (Signed)
Ms. Annette Weiss,  Your fecal occult blood test was positive.  This does not mean that you have colon cancer, but it does mean that we need to do a colonoscopy to look for cancer and for colon polyps.  Bonita Quin,  Please assist [name] with getting set up for a colonoscopy.  A translator will likely be needed.

## 2023-04-02 ENCOUNTER — Ambulatory Visit (AMBULATORY_SURGERY_CENTER): Payer: Medicaid Other | Admitting: *Deleted

## 2023-04-02 VITALS — Ht 60.0 in | Wt 127.0 lb

## 2023-04-02 DIAGNOSIS — Z1211 Encounter for screening for malignant neoplasm of colon: Secondary | ICD-10-CM

## 2023-04-02 MED ORDER — PEG 3350-KCL-NA BICARB-NACL 420 G PO SOLR
4000.0000 mL | Freq: Once | ORAL | 0 refills | Status: AC
Start: 2023-04-02 — End: 2023-04-02

## 2023-04-02 NOTE — Progress Notes (Signed)
.  Pt's name and DOB verified at the beginning of the pre-visit.  Pt denies any difficulty with ambulating,sitting, laying down or rolling side to side Gave both [facility] main # and MD on call # prior to instructions.  No egg or soy allergy known to patient  No issues known to pt with past sedation with any surgeries or procedures Pt denies having issues being intubated Patient denies ever being intubated Pt has no issues moving head neck or swallowing No FH of Malignant Hyperthermia Pt is not on diet pills Pt is not on home 02  Pt is not on blood thinners  Pt denies issues with constipation  Pt has frequent issues with constipation RN instructed pt to use Miralax per bottles instructions a week before prep days. Pt states they will Pt is not on dialysis Pt denise any abnormal heart rhythms  Pt denies any upcoming cardiac testing Pt encouraged to use to use Singlecare or Goodrx to reduce cost  Patient's chart reviewed by Cathlyn Parsons CNRA prior to pre-visit and patient appropriate for the [facility].  Pre-visit completed and red dot placed by patient's name on their procedure day (on provider's schedule).  . Visit in person with [place] interpreter Orlene Plum present Pt stated through interpreter that she understood all instructions Pt states weight is 127 lb Instructed pt why it is important to and  to call if they have any changes in health or new medications.Pt through interpreter states she will Instructions sent by mail with coupon and by my chart

## 2023-04-03 ENCOUNTER — Encounter: Payer: Medicaid Other | Admitting: Gastroenterology

## 2023-04-19 ENCOUNTER — Encounter: Payer: Medicaid Other | Admitting: Gastroenterology

## 2023-05-16 ENCOUNTER — Encounter: Payer: Self-pay | Admitting: Physician Assistant

## 2023-05-16 ENCOUNTER — Ambulatory Visit (INDEPENDENT_AMBULATORY_CARE_PROVIDER_SITE_OTHER): Payer: Medicaid Other | Admitting: Physician Assistant

## 2023-05-16 DIAGNOSIS — M25512 Pain in left shoulder: Secondary | ICD-10-CM

## 2023-05-16 DIAGNOSIS — M25562 Pain in left knee: Secondary | ICD-10-CM

## 2023-05-16 DIAGNOSIS — M542 Cervicalgia: Secondary | ICD-10-CM

## 2023-05-16 DIAGNOSIS — M25511 Pain in right shoulder: Secondary | ICD-10-CM

## 2023-05-16 DIAGNOSIS — M545 Low back pain, unspecified: Secondary | ICD-10-CM

## 2023-05-16 DIAGNOSIS — M25561 Pain in right knee: Secondary | ICD-10-CM

## 2023-05-16 NOTE — Progress Notes (Signed)
Office Visit Note   Patient: Annette Weiss           Date of Birth: 29-Dec-1950           MRN: [id number] Visit Date: 05/16/2023              Requested by: Quita Skye, PA-C [address] [place],  [place] [address] PCP: Quita Skye, PA-C   Assessment & Plan: Visit Diagnoses:  1. Cervicalgia   2. Low back pain, unspecified back pain laterality, unspecified chronicity, unspecified whether sciatica present   3. Pain in both knees, unspecified chronicity   4. Bilateral shoulder pain, unspecified chronicity     Plan: Patient is seen with the assistance of the [place] interpreter today.  She is a pleasant 72 year old woman with a 3-week history of polyarthralgia.  She does have a history of injuries in the past.  She denies any fever chills or change in activity.  She relates a history of all of her joints starting with her neck her right shoulder left shoulder her knees and hips began aching.  She also relates a history of fatigue that began at this time.  No fever or chills.  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  She does have some impingement in her right shoulder but the polyarthralgia and the nature of the presentation may be consistent with a myositis or another autoimmune issue.  I think appropriately she should be referred to rheumatology which I did today.  She does have a little right shoulder impingement I offered her injection but she declined.  She is getting better than she was initially and this is encouraging.  She asked about the brucellosis and I would have her coordinate this with her primary care physician.  If warranted she could be referred to infectious disease.  We also briefly discussed a steroid Dosepak but she has osteoporosis and she does not want to do this.  She did have inflammatory labs drawn by her primary care which did not show any acute findings  Follow-Up Instructions: Return With rheumatology and care.    Orders:  Orders Placed This Encounter  Procedures   Ambulatory referral to Rheumatology   No orders of the defined types were placed in this encounter.     Procedures: No procedures performed   Clinical Data: No additional findings.   Subjective: No chief complaint on file.   HPI pleasant 72 year old [place]-speaking woman in her who has a 3-week history of polyarthralgia.  She denies any change in activity.  She has had some injuries in the past but all of her joints starting with her neck and shoulders started aching a few weeks ago.  She also relates at that time she began getting significant fatigue.  She tried a massage but that did not help her.  She admits she is getting a little bit better.  Review of Systems  All other systems reviewed and are negative.    Objective: Vital Signs: There were no vitals taken for this visit.  Physical Exam Constitutional:      Appearance: Normal appearance.  Pulmonary:     Effort: Pulmonary effort is normal.  Neurological:     General: No focal deficit present.     Mental Status: She is alert.  Psychiatric:        Mood and Affect: Mood normal.        Behavior: Behavior normal.     Ortho  Exam Examination patient sitting comfortably in a chair.  She has range of motion of all of her extremities though admits she is just sore all over.  Her strength and sensation is overall intact and just limited by achiness.  Negative straight leg raise negative radicular for the findings on her right shoulder she does have good motion but does have impingement findings Specialty Comments:  No specialty comments available.  Imaging: No results found.   PMFS History: Patient Active Problem List   Diagnosis Date Noted   Cholelithiasis 11/13/2022   Gastroesophageal reflux disease 05/15/2022   LUQ abdominal pain 05/15/2022   Constipation 05/15/2022   Loss of weight 05/15/2022   Essential hypertension 07/21/2021   Influenza vaccine  refused 01/18/2021   COVID-19 vaccine dose declined 01/18/2021   Tetanus, diphtheria, and acellular pertussis (Tdap) vaccination declined 01/18/2021   Past Medical History:  Diagnosis Date   Anemia    Arthritis    Blood transfusion without reported diagnosis    Cataract    Clotting disorder (HCC)    Gallstones    GERD (gastroesophageal reflux disease)    Hyperlipidemia    Hypertension    Osteoporosis    Palpitations    Thyroid disease     Family History  Problem Relation Age of Onset   Hypertension Mother    Heart disease Mother    Stroke Father    Stomach cancer Neg Hx    Colon cancer Neg Hx    Esophageal cancer Neg Hx    Colon polyps Neg Hx    Rectal cancer Neg Hx     Past Surgical History:  Procedure Laterality Date   CHOLECYSTECTOMY     EAR PINNA RECONSTRUCTION W/ RIB GRAFT     TONSILLECTOMY     Social History   Occupational History   Not on file  Tobacco Use   Smoking status: Never   Smokeless tobacco: Never  Vaping Use   Vaping Use: Never used  Substance and Sexual Activity   Alcohol use: Never   Drug use: Never   Sexual activity: Yes

## 2023-06-13 ENCOUNTER — Encounter: Payer: Self-pay | Admitting: Cardiology

## 2023-07-08 ENCOUNTER — Ambulatory Visit: Payer: No Typology Code available for payment source | Admitting: Cardiology

## 2023-07-09 ENCOUNTER — Encounter: Payer: Medicaid Other | Admitting: Obstetrics and Gynecology

## 2023-07-09 ENCOUNTER — Ambulatory Visit: Payer: Medicaid Other | Admitting: Cardiology

## 2023-11-04 ENCOUNTER — Encounter: Payer: Self-pay | Admitting: Gastroenterology

## 2023-11-13 NOTE — Progress Notes (Unsigned)
Cardiology Office Note:    Date:  11/14/2023   ID:  Annette Weiss, DOB [birth year], MRN [id number]  PCP:  Quita Skye, PA-C   [facility] Providers Cardiologist:  None {   Referring MD: Quita Skye, PA-C    History of Present Illness:    Annette Weiss is a 72 y.o. female with a hx of HTN, HLD, and GERD who presents for follow-up.  She was referred by Bertram Denver, NP for further evaluation of palpitations, initially seen by Dr. Shari Prows 01/2022.  Echocardiogram 02/26/2022 showed EF 60 to 65%, normal RV function, mild to moderate left atrial enlargement, mild mitral regurgitation, mild to moderate tricuspid regurgitation, dilated aortic root measuring 41 mm.  CTA chest/2024 showed ectasia of ascending aorta without significant dilatation, measured 36 mm.  Since last clinic visit, she reports she has been having palpitations, occurs about 1-2 times per week and feels like heart is skipping beats.  Will last for a few minutes.  She denies any syncopal episodes.  She also reports left-sided chest pain occurring about once per week, occurs when she is under stress.  Describes dull pain.  Denies exertional chest pain but reports has not been exercising recently.  Does report some shortness of breath with exertion however.  Past Medical History:  Diagnosis Date   Anemia    Arthritis    Blood transfusion without reported diagnosis    Cataract    Clotting disorder (HCC)    Gallstones    GERD (gastroesophageal reflux disease)    Hyperlipidemia    Hypertension    Osteoporosis    Palpitations    Thyroid disease     Past Surgical History:  Procedure Laterality Date   CHOLECYSTECTOMY     EAR PINNA RECONSTRUCTION W/ RIB GRAFT     TONSILLECTOMY      Current Medications: Current Meds  Medication Sig   fenofibrate (TRICOR) 48 MG tablet Take by mouth.   Multiple Vitamin (MULTIVITAMIN ADULT PO) Take 1 tablet by mouth daily.   [DISCONTINUED] metoprolol tartrate (LOPRESSOR) 25 MG  tablet Take 2 hours before CT scan.     Allergies:   Statins   Social History   Socioeconomic History   Marital status: Married    Spouse name: Not on file   Number of children: Not on file   Years of education: Not on file   Highest education level: Not on file  Occupational History   Not on file  Tobacco Use   Smoking status: Never   Smokeless tobacco: Never  Vaping Use   Vaping status: Never Used  Substance and Sexual Activity   Alcohol use: Never   Drug use: Never   Sexual activity: Yes  Other Topics Concern   Not on file  Social History Narrative   Not on file   Social Drivers of Health   Financial Resource Strain: Not at Risk (02/26/2023)   Received from [place], [employer]    Financial Resource Strain: 1  Food Insecurity: Not at Risk (02/26/2023)   Received from [place], [place]   Food Insecurity    Food: 1  Transportation Needs: Not at Risk (02/26/2023)   Received from [place], [employer] Needs    Transportation: 1  Physical Activity: Not on File (08/22/2022)   Received from [place], [place]   Physical Activity    Physical Activity: 0  Stress: Not on File (08/22/2022)   Received from [place], [place]   Stress    Stress:  0  Social Connections: Not on File (07/30/2023)   Received from [facility]   Social Connections    Connectedness: 0     Family History: The patient's family history includes Heart disease in her mother; Hypertension in her mother; Stroke in her father. There is no history of Stomach cancer, Colon cancer, Esophageal cancer, Colon polyps, or Rectal cancer.  ROS:   Review of Systems  Constitutional:  Negative for chills and diaphoresis.  HENT:  Negative for sinus pain and sore throat.   Eyes:  Negative for blurred vision and photophobia.  Respiratory:  Negative for sputum production and shortness of breath.   Cardiovascular:  Positive for chest pain and palpitations. Negative for orthopnea, claudication, leg swelling and PND.   Gastrointestinal:  Negative for diarrhea and melena.  Genitourinary:  Negative for flank pain.  Musculoskeletal:  Negative for falls.  Neurological:  Positive for dizziness. Negative for loss of consciousness.  Endo/Heme/Allergies:  Does not bruise/bleed easily.  Psychiatric/Behavioral:  Negative for depression and hallucinations.      EKGs/Labs/Other Studies Reviewed:    The following studies were reviewed today:  No prior cardiovascular studies available.   EKG:  11/14/2023: Sinus bradycardia, rate 54  Recent Labs: 03/06/2023: ALT 13; BUN 15; Creatinine, Ser 0.55; Potassium 4.6; Sodium 139   Recent Lipid Panel    Component Value Date/Time   CHOL 270 (H) 01/18/2021 0950   TRIG 108 01/18/2021 0950   HDL 70 01/18/2021 0950   CHOLHDL 3.9 01/18/2021 0950   LDLCALC 181 (H) 01/18/2021 0950     Risk Assessment/Calculations:           Physical Exam:    VS:  BP 110/60 (BP Location: Left Arm, Patient Position: Sitting, Cuff Size: Normal)   Pulse 63   Ht 5' (1.524 m)   Wt 119 lb (54 kg)   BMI 23.24 kg/m     Wt Readings from Last 3 Encounters:  11/14/23 119 lb (54 kg)  04/02/23 127 lb (57.6 kg)  03/06/23 123 lb (55.8 kg)     GEN: Well nourished, well developed in no acute distress HEENT: Normal NECK: No JVD; No carotid bruits LYMPHATICS: No lymphadenopathy CARDIAC: RRR, no murmurs, rubs, gallops RESPIRATORY:  Clear to auscultation without rales, wheezing or rhonchi  ABDOMEN: Soft, non-tender, non-distended MUSCULOSKELETAL:  No edema; No deformity  SKIN: Warm and dry NEUROLOGIC:  Alert and oriented x 3 PSYCHIATRIC:  Normal affect   ASSESSMENT:    1. Chest pain of uncertain etiology   2. Precordial pain   3. Palpitations   4. Mixed hyperlipidemia   5. Aortic valve insufficiency, etiology of cardiac valve disease unspecified   6. Pre-procedure lab exam     PLAN:     Chest pain:  Atypical in description but does report can occur when under stress,  suggesting possible angina.  Does have CAD risk factors with history of hypertension and hyperlipidemia, has been unable to tolerate statin.  Recommend coronary CTA to evaluate for obstructive CAD.  Resting heart rate in 50s in clinic today, will hold off on giving metoprolol prior to study   Palpitations: Description concerning for arrhythmia, evaluate with Zio patch x 2 weeks  HTN: Previously on enalapril but reports she stopped taking.  Appears normotensive off antihypertensives currently  Aortic regurgitation:  Moderate on echocardiogram 02/2022.  Recommend repeat echocardiogram to monitor  HLD: Statin Intolerance: LDL 181. Did not tolerate statins and reports she has an allergy to statin meds. Declined starting zetia.  LDL  improved to 133 on 01/16/2023.  Update lipid panel.  Follow-up results of coronary CTA as above to guide aggressive to be in lowering cholesterol    RTC in 3 months  Medication Adjustments/Labs and Tests Ordered: Current medicines are reviewed at length with the patient today.  Concerns regarding medicines are outlined above.   Orders Placed This Encounter  Procedures   CT CORONARY MORPH W/CTA COR W/SCORE W/CA W/CM &/OR WO/CM   Basic Metabolic Panel (BMET)   Lipid panel   LONG TERM MONITOR (3-14 DAYS)   EKG 12-Lead   ECHOCARDIOGRAM COMPLETE   Meds ordered this encounter  Medications   DISCONTD: metoprolol tartrate (LOPRESSOR) 25 MG tablet    Sig: Take 2 hours before CT scan.    Dispense:  1 tablet    Refill:  0   Patient Instructions  Medication Instructions:  Your physician recommends that you continue on your current medications as directed. Please refer to the Current Medication list given to you today.  *If you need a refill on your cardiac medications before your next appointment, please call your pharmacy*   Lab Work: BMET, Lipids If you have labs (blood work) drawn today and your tests are completely normal, you will receive your results only  by: MyChart Message (if you have MyChart) OR A paper copy in the mail If you have any lab test that is abnormal or we need to change your treatment, we will call you to review the results.   Testing/Procedures: Your physician has requested that you have an echocardiogram. Echocardiography is a painless test that uses sound waves to create images of your heart. It provides your doctor with information about the size and shape of your heart and how well your heart's chambers and valves are working. This procedure takes approximately one hour. There are no restrictions for this procedure. Please do NOT wear cologne, perfume, aftershave, or lotions (deodorant is allowed). Please arrive 15 minutes prior to your appointment time.  Please note: We ask at that you not bring children with you during ultrasound (echo/ vascular) testing. Due to room size and safety concerns, children are not allowed in the ultrasound rooms during exams. Our front office staff cannot provide observation of children in our lobby area while testing is being conducted. An adult accompanying a patient to their appointment will only be allowed in the ultrasound room at the discretion of the ultrasound technician under special circumstances. We apologize for any inconvenience.    Your cardiac CT will be scheduled at one of the below locations:   [facility] [address] [place], [place] [address] [phone]  If scheduled at [facility], please arrive at the [facility] and Children's Entrance (Entrance C2) of [facility] 30 minutes prior to test start time. You can use the FREE valet parking offered at entrance C (encouraged to control the heart rate for the test)  Proceed to the [facility] Radiology Department (first floor) to check-in and test prep.  All radiology patients and guests should use entrance C2 at [facility], accessed from [facility], even though the hospital's  physical address listed is [address].     Please follow these instructions carefully (unless otherwise directed):  An IV will be required for this test and Nitroglycerin will be given.   On the Night Before the Test: Be sure to Drink plenty of water. Do not consume any caffeinated/decaffeinated beverages or chocolate 12 hours prior to  your test. Do not take any antihistamines 12 hours prior to your test.  On the Day of the Test: Drink plenty of water until 1 hour prior to the test. Do not eat any food 1 hour prior to test. You may take your regular medications prior to the test.  If you take Furosemide/Hydrochlorothiazide/Spironolactone/Chlorthalidone, please HOLD on the morning of the test. Patients who wear a continuous glucose monitor MUST remove the device prior to scanning. FEMALES- please wear underwire-free bra if available, avoid dresses & tight clothing      After the Test: Drink plenty of water. After receiving IV contrast, you may experience a mild flushed feeling. This is normal. On occasion, you may experience a mild rash up to 24 hours after the test. This is not dangerous. If this occurs, you can take Benadryl 25 mg and increase your fluid intake. If you experience trouble breathing, this can be serious. If it is severe call 911 IMMEDIATELY. If it is mild, please call our office.  We will call to schedule your test 2-4 weeks out understanding that some insurance companies will need an authorization prior to the service being performed.   For more information and frequently asked questions, please visit our website : [email or web address]  For non-scheduling related questions, please contact the cardiac imaging nurse navigator should you have any questions/concerns: Cardiac Imaging Nurse Navigators Direct Office Dial: [phone]   For scheduling needs, including cancellations and rescheduling, please call [place], [phone].  ZIO XT-  Long Term Monitor Instructions  Your physician has requested you wear a ZIO patch monitor for 14 days.  This is a single patch monitor. [employer] supplies one patch monitor per enrollment. Additional stickers are not available. Please do not apply patch if you will be having a Nuclear Stress Test,  Echocardiogram, Cardiac CT, MRI, or Chest Xray during the period you would be wearing the  monitor. The patch cannot be worn during these tests. You cannot remove and re-apply the  ZIO XT patch monitor.  Your ZIO patch monitor will be mailed 3 day USPS to your address on file. It may take 3-5 days  to receive your monitor after you have been enrolled.  Once you have received your monitor, please review the enclosed instructions. Your monitor  has already been registered assigning a specific monitor serial # to you.  Billing and Patient Assistance Program Information  We have supplied [employer] with any of your insurance information on file for billing purposes. [employer] offers a sliding scale Patient Assistance Program for patients that do not have  insurance, or whose insurance does not completely cover the cost of the ZIO monitor.  You must apply for the Patient Assistance Program to qualify for this discounted rate.  To apply, please call [employer] at [phone], select option 4, select option 2, ask to apply for  Patient Assistance Program. Meredeth Ide will ask your household income, and how many people  are in your household. They will quote your out-of-pocket cost based on that information.  [employer] will also be able to set up a 53-month, interest-free payment plan if needed.  Applying the monitor   Shave hair from upper left chest.  Hold abrader disc by orange tab. Rub abrader in 40 strokes over the upper left chest as  indicated in your monitor instructions.  Clean area with 4 enclosed alcohol pads. Let dry.  Apply patch as indicated in monitor instructions. Patch will be placed under  collarbone on left  side of chest with  arrow pointing upward.  Rub patch adhesive wings for 2 minutes. Remove white label marked "1". Remove the white  label marked "2". Rub patch adhesive wings for 2 additional minutes.  While looking in a mirror, press and release button in center of patch. A small green light will  flash 3-4 times. This will be your only indicator that the monitor has been turned on.  Do not shower for the first 24 hours. You may shower after the first 24 hours.  Press the button if you feel a symptom. You will hear a small click. Record Date, Time and  Symptom in the Patient Logbook.  When you are ready to remove the patch, follow instructions on the last 2 pages of Patient  Logbook. Stick patch monitor onto the last page of Patient Logbook.  Place Patient Logbook in the blue and white box. Use locking tab on box and tape box closed  securely. The blue and white box has prepaid postage on it. Please place it in the mailbox as  soon as possible. Your physician should have your test results approximately 7 days after the  monitor has been mailed back to [facility].  Call [facility] Customer Care at [phone] if you have questions regarding  your ZIO XT patch monitor. Call them immediately if you see an orange light blinking on your  monitor.  If your monitor falls off in less than 4 days, contact our Monitor department at [phone].  If your monitor becomes loose or falls off after 4 days call [employer] at [phone] for  suggestions on securing your monitor  Follow-Up: At [facility], you and your health needs are our priority.  As part of our continuing mission to provide you with exceptional heart care, we have created designated Provider Care Teams.  These Care Teams include your primary Cardiologist (physician) and Advanced Practice Providers (APPs -  Physician Assistants and Nurse Practitioners) who all work together to provide you with the  care you need, when you need it.  Your next appointment:   3 month(s)  Provider:   Epifanio Lesches, MD    Signed, Little Ishikawa, MD  11/14/2023 5:14 PM     [facility]

## 2023-11-14 ENCOUNTER — Ambulatory Visit: Payer: Medicaid Other | Attending: Cardiology

## 2023-11-14 ENCOUNTER — Ambulatory Visit: Payer: Medicaid Other | Attending: Cardiology | Admitting: Cardiology

## 2023-11-14 ENCOUNTER — Encounter: Payer: Self-pay | Admitting: Cardiology

## 2023-11-14 VITALS — BP 110/60 | HR 63 | Ht 60.0 in | Wt 119.0 lb

## 2023-11-14 DIAGNOSIS — R002 Palpitations: Secondary | ICD-10-CM

## 2023-11-14 DIAGNOSIS — I351 Nonrheumatic aortic (valve) insufficiency: Secondary | ICD-10-CM

## 2023-11-14 DIAGNOSIS — R079 Chest pain, unspecified: Secondary | ICD-10-CM | POA: Diagnosis not present

## 2023-11-14 DIAGNOSIS — R072 Precordial pain: Secondary | ICD-10-CM | POA: Diagnosis not present

## 2023-11-14 DIAGNOSIS — E782 Mixed hyperlipidemia: Secondary | ICD-10-CM

## 2023-11-14 DIAGNOSIS — Z01812 Encounter for preprocedural laboratory examination: Secondary | ICD-10-CM

## 2023-11-14 LAB — LIPID PANEL
Chol/HDL Ratio: 3 {ratio} (ref 0.0–4.4)
Cholesterol, Total: 222 mg/dL — ABNORMAL HIGH (ref 100–199)
HDL: 75 mg/dL (ref >39)
LDL Chol Calc (NIH): 136 mg/dL — ABNORMAL HIGH (ref 0–99)
Triglycerides: 61 mg/dL (ref 0–149)
VLDL Cholesterol Cal: 11 mg/dL (ref 5–40)

## 2023-11-14 LAB — BASIC METABOLIC PANEL
BUN/Creatinine Ratio: 22 (ref 12–28)
BUN: 14 mg/dL (ref 8–27)
CO2: 23 mmol/L (ref 20–29)
Calcium: 10 mg/dL (ref 8.7–10.3)
Chloride: 104 mmol/L (ref 96–106)
Creatinine, Ser: 0.64 mg/dL (ref 0.57–1.00)
Glucose: 81 mg/dL (ref 70–99)
Potassium: 4.5 mmol/L (ref 3.5–5.2)
Sodium: 141 mmol/L (ref 134–144)
eGFR: 93 mL/min/{1.73_m2} (ref >59)

## 2023-11-14 MED ORDER — METOPROLOL TARTRATE 25 MG PO TABS: ORAL_TABLET | ORAL | 0 refills | Status: DC

## 2023-11-14 NOTE — Progress Notes (Unsigned)
Enrolled for [employer] to mail a ZIO XT long term holter monitor to the patients address on file.  

## 2023-11-14 NOTE — Patient Instructions (Addendum)
Medication Instructions:  Your physician recommends that you continue on your current medications as directed. Please refer to the Current Medication list given to you today.  *If you need a refill on your cardiac medications before your next appointment, please call your pharmacy*   Lab Work: BMET, Lipids If you have labs (blood work) drawn today and your tests are completely normal, you will receive your results only by: MyChart Message (if you have MyChart) OR A paper copy in the mail If you have any lab test that is abnormal or we need to change your treatment, we will call you to review the results.   Testing/Procedures: Your physician has requested that you have an echocardiogram. Echocardiography is a painless test that uses sound waves to create images of your heart. It provides your doctor with information about the size and shape of your heart and how well your heart's chambers and valves are working. This procedure takes approximately one hour. There are no restrictions for this procedure. Please do NOT wear cologne, perfume, aftershave, or lotions (deodorant is allowed). Please arrive 15 minutes prior to your appointment time.  Please note: We ask at that you not bring children with you during ultrasound (echo/ vascular) testing. Due to room size and safety concerns, children are not allowed in the ultrasound rooms during exams. Our front office staff cannot provide observation of children in our lobby area while testing is being conducted. An adult accompanying a patient to their appointment will only be allowed in the ultrasound room at the discretion of the ultrasound technician under special circumstances. We apologize for any inconvenience.    Your cardiac CT will be scheduled at one of the below locations:   [facility] [address] [place], [place] [address] [phone]  If scheduled at [facility], please arrive at the [facility] and Children's  Entrance (Entrance C2) of [facility] 30 minutes prior to test start time. You can use the FREE valet parking offered at entrance C (encouraged to control the heart rate for the test)  Proceed to the [facility] Radiology Department (first floor) to check-in and test prep.  All radiology patients and guests should use entrance C2 at [facility], accessed from [facility], even though the hospital's physical address listed is [address].     Please follow these instructions carefully (unless otherwise directed):  An IV will be required for this test and Nitroglycerin will be given.   On the Night Before the Test: Be sure to Drink plenty of water. Do not consume any caffeinated/decaffeinated beverages or chocolate 12 hours prior to your test. Do not take any antihistamines 12 hours prior to your test.  On the Day of the Test: Drink plenty of water until 1 hour prior to the test. Do not eat any food 1 hour prior to test. You may take your regular medications prior to the test.  If you take Furosemide/Hydrochlorothiazide/Spironolactone/Chlorthalidone, please HOLD on the morning of the test. Patients who wear a continuous glucose monitor MUST remove the device prior to scanning. FEMALES- please wear underwire-free bra if available, avoid dresses & tight clothing      After the Test: Drink plenty of water. After receiving IV contrast, you may experience a mild flushed feeling. This is normal. On occasion, you may experience a mild rash up to 24 hours after the test. This is not dangerous. If this occurs, you can take Benadryl 25 mg and increase  your fluid intake. If you experience trouble breathing, this can be serious. If it is severe call 911 IMMEDIATELY. If it is mild, please call our office.  We will call to schedule your test 2-4 weeks out understanding that some insurance companies will need an authorization prior to the service being performed.    For more information and frequently asked questions, please visit our website : [email or web address]  For non-scheduling related questions, please contact the cardiac imaging nurse navigator should you have any questions/concerns: Cardiac Imaging Nurse Navigators Direct Office Dial: [phone]   For scheduling needs, including cancellations and rescheduling, please call [place], [phone].  ZIO XT- Long Term Monitor Instructions  Your physician has requested you wear a ZIO patch monitor for 14 days.  This is a single patch monitor. [employer] supplies one patch monitor per enrollment. Additional stickers are not available. Please do not apply patch if you will be having a Nuclear Stress Test,  Echocardiogram, Cardiac CT, MRI, or Chest Xray during the period you would be wearing the  monitor. The patch cannot be worn during these tests. You cannot remove and re-apply the  ZIO XT patch monitor.  Your ZIO patch monitor will be mailed 3 day USPS to your address on file. It may take 3-5 days  to receive your monitor after you have been enrolled.  Once you have received your monitor, please review the enclosed instructions. Your monitor  has already been registered assigning a specific monitor serial # to you.  Billing and Patient Assistance Program Information  We have supplied [employer] with any of your insurance information on file for billing purposes. [employer] offers a sliding scale Patient Assistance Program for patients that do not have  insurance, or whose insurance does not completely cover the cost of the ZIO monitor.  You must apply for the Patient Assistance Program to qualify for this discounted rate.  To apply, please call [employer] at [phone], select option 4, select option 2, ask to apply for  Patient Assistance Program. Meredeth Ide will ask your household income, and how many people  are in your household. They will quote your out-of-pocket cost based on that  information.  [employer] will also be able to set up a 61-month, interest-free payment plan if needed.  Applying the monitor   Shave hair from upper left chest.  Hold abrader disc by orange tab. Rub abrader in 40 strokes over the upper left chest as  indicated in your monitor instructions.  Clean area with 4 enclosed alcohol pads. Let dry.  Apply patch as indicated in monitor instructions. Patch will be placed under collarbone on left  side of chest with arrow pointing upward.  Rub patch adhesive wings for 2 minutes. Remove white label marked "1". Remove the white  label marked "2". Rub patch adhesive wings for 2 additional minutes.  While looking in a mirror, press and release button in center of patch. A small green light will  flash 3-4 times. This will be your only indicator that the monitor has been turned on.  Do not shower for the first 24 hours. You may shower after the first 24 hours.  Press the button if you feel a symptom. You will hear a small click. Record Date, Time and  Symptom in the Patient Logbook.  When you are ready to remove the patch, follow instructions on the last 2 pages of Patient  Logbook. Stick patch monitor onto the last page of Patient Logbook.  Place Patient Logbook in the blue  and white box. Use locking tab on box and tape box closed  securely. The blue and white box has prepaid postage on it. Please place it in the mailbox as  soon as possible. Your physician should have your test results approximately 7 days after the  monitor has been mailed back to [facility]).  Call [facility] Customer Care at [phone] if you have questions regarding  your ZIO XT patch monitor. Call them immediately if you see an orange light blinking on your  monitor.  If your monitor falls off in less than 4 days, contact our Monitor department at [phone].  If your monitor becomes loose or falls off after 4 days call [employer] at [phone] for  suggestions on  securing your monitor  Follow-Up: At [facility], you and your health needs are our priority.  As part of our continuing mission to provide you with exceptional heart care, we have created designated Provider Care Teams.  These Care Teams include your primary Cardiologist (physician) and Advanced Practice Providers (APPs -  Physician Assistants and Nurse Practitioners) who all work together to provide you with the care you need, when you need it.  Your next appointment:   3 month(s)  Provider:   Epifanio Lesches, MD

## 2023-11-18 ENCOUNTER — Telehealth: Payer: Self-pay | Admitting: *Deleted

## 2023-11-18 NOTE — Telephone Encounter (Signed)
Called patient with [place] interpreter present patient made aware that per Dr. Bjorn Pippin Cholesterol is elevated,  and Dr. Bjorn Pippin would like to  follow-up  with coronary CTA to guide how aggressive to be in lowering cholesterol. Made the patient aware that  normal kidney function and electrolytes. Patient voiced an understanding.

## 2023-11-22 DIAGNOSIS — R002 Palpitations: Secondary | ICD-10-CM

## 2023-12-12 ENCOUNTER — Ambulatory Visit (HOSPITAL_COMMUNITY): Payer: Medicaid Other | Attending: Cardiology

## 2023-12-12 DIAGNOSIS — I351 Nonrheumatic aortic (valve) insufficiency: Secondary | ICD-10-CM | POA: Diagnosis present

## 2023-12-12 LAB — ECHOCARDIOGRAM COMPLETE
AV Vena cont: 0.34 cm
Area-P 1/2: 3.08 cm2
MV M vel: 5.46 m/s
MV Peak grad: 119.2 mm[Hg]
Radius: 0.45 cm
S' Lateral: 3.09 cm

## 2023-12-13 ENCOUNTER — Encounter: Payer: Self-pay | Admitting: *Deleted

## 2023-12-31 ENCOUNTER — Encounter (HOSPITAL_COMMUNITY): Payer: Self-pay

## 2024-01-02 ENCOUNTER — Ambulatory Visit (HOSPITAL_COMMUNITY)
Admission: RE | Admit: 2024-01-02 | Discharge: 2024-01-02 | Disposition: A | Discharge status: Home / Self Care | Payer: Medicaid Other | Source: Ambulatory Visit | Attending: Cardiology | Admitting: Cardiology

## 2024-01-02 DIAGNOSIS — R072 Precordial pain: Secondary | ICD-10-CM | POA: Diagnosis present

## 2024-01-02 MED ORDER — NITROGLYCERIN 0.4 MG SL SUBL
SUBLINGUAL_TABLET | SUBLINGUAL | Status: AC
  Filled 2024-01-02: qty 2

## 2024-01-02 MED ORDER — METOPROLOL TARTRATE 5 MG/5ML IV SOLN: 10.0000 mg | INTRAVENOUS | Status: DC | PRN

## 2024-01-02 MED ORDER — METOPROLOL TARTRATE 5 MG/5ML IV SOLN
INTRAVENOUS | Status: AC
  Filled 2024-01-02: qty 5

## 2024-01-02 MED ORDER — IOHEXOL 350 MG/ML SOLN
95.0000 mL | Freq: Once | INTRAVENOUS | Status: AC | PRN
  Administered 2024-01-02: 95 mL via INTRAVENOUS

## 2024-01-02 MED ORDER — NITROGLYCERIN 0.4 MG SL SUBL
0.8000 mg | SUBLINGUAL_TABLET | Freq: Once | SUBLINGUAL | Status: AC
  Administered 2024-01-02: 0.8 mg via SUBLINGUAL

## 2024-01-07 ENCOUNTER — Other Ambulatory Visit: Payer: Self-pay

## 2024-01-07 DIAGNOSIS — E782 Mixed hyperlipidemia: Secondary | ICD-10-CM

## 2024-01-13 ENCOUNTER — Encounter: Payer: Self-pay | Admitting: *Deleted

## 2024-01-22 ENCOUNTER — Other Ambulatory Visit: Payer: Self-pay | Admitting: Physician Assistant

## 2024-01-22 ENCOUNTER — Encounter: Payer: Self-pay | Admitting: Pharmacist Clinician (PhC)/ Clinical Pharmacy Specialist

## 2024-01-22 ENCOUNTER — Ambulatory Visit: Attending: Cardiology | Admitting: Pharmacist Clinician (PhC)/ Clinical Pharmacy Specialist

## 2024-01-22 DIAGNOSIS — E785 Hyperlipidemia, unspecified: Secondary | ICD-10-CM | POA: Insufficient documentation

## 2024-01-22 DIAGNOSIS — G8929 Other chronic pain: Secondary | ICD-10-CM

## 2024-01-22 NOTE — Progress Notes (Signed)
 Office Visit    Patient Name: Annette Weiss Date of Encounter: 01/22/2024  Primary Care Provider:  Quita Skye, PA-C Primary Cardiologist:  None  Chief Complaint    Hyperlipidemia   Significant Past Medical History   CAD CAC = 2.14  HTN Controlled w/o meds currently  palpitations 2 wk Zio patch showed no rhythm abnormalities     Allergies  Allergen Reactions   Statins Other (See Comments)    History of Present Illness    Annette Weiss is a 73 y.o. female patient of Dr Bjorn Pippin, in the office today to discuss options for cholesterol management.  She has a [place] language interpreter with her.  A recent coronary CT showed her to have a CAC of 2.14.  She has been on fenofibrate for some time, but doesn't like to take medications, stating that she has many side effects from most things she has tried.  She took rosuvastatin for 2-3 days and had myalgias as well as headaches and other complaints.   She also tells me that she spends half of the year in [place], and will be headed there again later this month.    Insurance Carrier:  [employer] (Medicaid)  LDL Cholesterol goal:  LDL < 70  Current Medications:  fenofibrate 48 mg every day   Previously tried:  rosuvastatin - myalgias  Family Hx: mother had tachycardia; father had alcohol issues; no health problems with siblings or kids (2)  Social Hx: Tobacco: no Alcohol: no   Diet:    not much protein some beans or chicken, vegetables and fruits regularly; states she is currently fasting, something lost in translation, apparently avoiding animal products during [place]  Exercise: walks most days, household chores  Accessory Clinical Findings   Lab Results  Component Value Date   CHOL 222 (H) 11/14/2023   HDL 75 11/14/2023   LDLCALC 136 (H) 11/14/2023   TRIG 61 11/14/2023   CHOLHDL 3.0 11/14/2023    No results found for: "LIPOA"  Lab Results  Component Value Date   ALT 13 03/06/2023   AST 17 03/06/2023    ALKPHOS 80 03/06/2023   BILITOT 0.5 03/06/2023   Lab Results  Component Value Date   CREATININE 0.64 11/14/2023   BUN 14 11/14/2023   NA 141 11/14/2023   K 4.5 11/14/2023   CL 104 11/14/2023   CO2 23 11/14/2023   Lab Results  Component Value Date   HGBA1C 5.4 01/18/2021    Home Medications    Current Outpatient Medications  Medication Sig Dispense Refill   fenofibrate (TRICOR) 48 MG tablet Take by mouth.     Multiple Vitamin (MULTIVITAMIN ADULT PO) Take 1 tablet by mouth daily.     No current facility-administered medications for this visit.     Assessment & Plan    Hyperlipidemia Assessment: Patient with minimal CVD not at LDL goal of < 70   Most recent LDL 136 on 11/14/2023  Not able to tolerate statin - rosuvastatin -  secondary to myalgias Leaving the country later this month, won't return until fall.   Reviewed options for lowering LDL cholesterol, including PCSK-9 inhibitors, bempedoic acid and inclisiran.  Discussed mechanisms of action, dosing, side effects, potential decreases in LDL cholesterol and costs.  Also reviewed potential options for patient assistance.  Plan: Patient agreeable to starting red yeast rice.  Start with 1 capsule daily and increase to 2 daily if tolerates.  Recommended Nature's Bounty (has USP validation) Stop fenofibrate, has minimal impact  on LDL, and triglycerides are excellent.   Repeat labs after she returns to the [place] in the fall Lipid Liver function    Phillips Hay, PharmD CPP [facility] [address]  [place], [place] [address] [phone]  01/22/2024, 4:35 PM

## 2024-01-22 NOTE — Patient Instructions (Addendum)
 Your Results:             Your most recent labs Goal  Total Cholesterol 222 < 200  Triglycerides 61 < 150  HDL (happy/good cholesterol) 75 > 40  LDL (lousy/bad cholesterol 136 < 70   Medication changes:  Start red yeast rice 600 mg once daily.  If you do well, increase to 2 tablets daily.  Stop fenofibrate - triglycerides are well WNL.    Lab orders:  We want to repeat labs when you come back to the [place] in the fall.  Just let us know when you're back and we can arrange that.   - Thank you for choosing [facility]

## 2024-01-22 NOTE — Assessment & Plan Note (Addendum)
 Assessment: Patient with minimal CVD not at LDL goal of < 70   Most recent LDL 136 on 11/14/2023  Not able to tolerate statin - rosuvastatin -  secondary to myalgias Leaving the country later this month, won't return until fall.   Reviewed options for lowering LDL cholesterol, including PCSK-9 inhibitors, bempedoic acid and inclisiran.  Discussed mechanisms of action, dosing, side effects, potential decreases in LDL cholesterol and costs.  Also reviewed potential options for patient assistance.  Plan: Patient agreeable to starting red yeast rice.  Start with 1 capsule daily and increase to 2 daily if tolerates.  Recommended Nature's Bounty (has USP validation) Stop fenofibrate, has minimal impact on LDL, and triglycerides are excellent.   Repeat labs after she returns to the [place] in the fall Lipid Liver function

## 2024-01-27 ENCOUNTER — Ambulatory Visit
Admission: RE | Admit: 2024-01-27 | Discharge: 2024-01-27 | Disposition: A | Discharge status: Home / Self Care | Source: Ambulatory Visit | Attending: Physician Assistant | Admitting: Physician Assistant

## 2024-01-27 DIAGNOSIS — G8929 Other chronic pain: Secondary | ICD-10-CM

## 2024-02-06 NOTE — Progress Notes (Unsigned)
 Cardiology Office Note:    Date:  02/07/2024   ID:  Annette Weiss, DOB Jun 30, 1951, MRN [id number]  PCP:  Quita Skye, PA-C   [facility] Providers Cardiologist:  Little Ishikawa, MD {   Referring MD: Quita Skye, PA-C    History of Present Illness:    Annette Weiss is a 73 y.o. female with a hx of HTN, HLD, and GERD who presents for follow-up.  She was referred by Bertram Denver, NP for further evaluation of palpitations, initially seen by Dr. Shari Prows 01/2022.  Echocardiogram 02/26/2022 showed EF 60 to 65%, normal RV function, mild to moderate left atrial enlargement, mild mitral regurgitation, mild to moderate tricuspid regurgitation, dilated aortic root measuring 41 mm.  CTA chest/2024 showed ectasia of ascending aorta without significant dilatation, measured 36 mm.  Coronary CTA on 01/03/2024 showed minimal CAD, calcium score 2 (30th percentile).  Zio patch x 14 days and 11/2023 showed no significant arrhythmia.  Echocardiogram 12/12/2023 showed EF 60 to 65%, normal RV function, moderate mitral regurgitation, moderate tricuspid regurgitation.  Since last clinic visit, she reports she is doing well.  Denies any chest pain, dyspnea, lightheadedness, syncope, lower extremity edema.  Reports occasional palpitations at night.   Past Medical History:  Diagnosis Date   Anemia    Arthritis    Blood transfusion without reported diagnosis    Cataract    Clotting disorder (HCC)    Gallstones    GERD (gastroesophageal reflux disease)    Hyperlipidemia    Hypertension    Osteoporosis    Palpitations    Thyroid disease     Past Surgical History:  Procedure Laterality Date   CHOLECYSTECTOMY     EAR PINNA RECONSTRUCTION W/ RIB GRAFT     TONSILLECTOMY      Current Medications: Current Meds  Medication Sig   fenofibrate (TRICOR) 48 MG tablet Take by mouth.   Multiple Vitamin (MULTIVITAMIN ADULT PO) Take 1 tablet by mouth daily.     Allergies:   Statins   Social  History   Socioeconomic History   Marital status: Married    Spouse name: Not on file   Number of children: Not on file   Years of education: Not on file   Highest education level: Not on file  Occupational History   Not on file  Tobacco Use   Smoking status: Never   Smokeless tobacco: Never  Vaping Use   Vaping status: Never Used  Substance and Sexual Activity   Alcohol use: Never   Drug use: Never   Sexual activity: Yes    Partners: Male    Comment: married  Other Topics Concern   Not on file  Social History Narrative   Not on file   Social Drivers of Health   Financial Resource Strain: Not at Risk (02/26/2023)   Received from [place], [employer]    Financial Resource Strain: 1  Food Insecurity: Not at Risk (02/26/2023)   Received from [place], [place]   Food Insecurity    Food: 1  Transportation Needs: Not at Risk (02/26/2023)   Received from [place], [employer] Needs    Transportation: 1  Physical Activity: Not on File (08/22/2022)   Received from [place], [place]   Physical Activity    Physical Activity: 0  Stress: Not on File (08/22/2022)   Received from [facility], [place]   Stress    Stress: 0  Social Connections: Not on File (07/30/2023)   Received from [facility]  Social Connections    Connectedness: 0     Family History: The patient's family history includes Heart disease in her mother; Hypertension in her mother; Stroke in her father. There is no history of Stomach cancer, Colon cancer, Esophageal cancer, Colon polyps, or Rectal cancer.  ROS:   Review of Systems  Constitutional:  Negative for chills and diaphoresis.  HENT:  Negative for sinus pain and sore throat.   Eyes:  Negative for blurred vision and photophobia.  Respiratory:  Negative for sputum production and shortness of breath.   Cardiovascular:  Positive for chest pain and palpitations. Negative for orthopnea, claudication, leg swelling and PND.  Gastrointestinal:  Negative for  diarrhea and melena.  Genitourinary:  Negative for flank pain.  Musculoskeletal:  Negative for falls.  Neurological:  Positive for dizziness. Negative for loss of consciousness.  Endo/Heme/Allergies:  Does not bruise/bleed easily.  Psychiatric/Behavioral:  Negative for depression and hallucinations.      EKGs/Labs/Other Studies Reviewed:    The following studies were reviewed today:  No prior cardiovascular studies available.   EKG:  11/14/2023: Sinus bradycardia, rate 54  Recent Labs: 03/06/2023: ALT 13 11/14/2023: BUN 14; Creatinine, Ser 0.64; Potassium 4.5; Sodium 141   Recent Lipid Panel    Component Value Date/Time   CHOL 222 (H) 11/14/2023 0951   TRIG 61 11/14/2023 0951   HDL 75 11/14/2023 0951   CHOLHDL 3.0 11/14/2023 0951   LDLCALC 136 (H) 11/14/2023 0951     Risk Assessment/Calculations:           Physical Exam:    VS:  BP 134/72   Pulse 66   Wt 135 lb 9.6 oz (61.5 kg)   SpO2 98%   BMI 26.48 kg/m     Wt Readings from Last 3 Encounters:  02/07/24 135 lb 9.6 oz (61.5 kg)  11/14/23 119 lb (54 kg)  04/02/23 127 lb (57.6 kg)     GEN: Well nourished, well developed in no acute distress HEENT: Normal NECK: No JVD; No carotid bruits LYMPHATICS: No lymphadenopathy CARDIAC: RRR, no murmurs, rubs, gallops RESPIRATORY:  Clear to auscultation without rales, wheezing or rhonchi  ABDOMEN: Soft, non-tender, non-distended MUSCULOSKELETAL:  No edema; No deformity  SKIN: Warm and dry NEUROLOGIC:  Alert and oriented x 3 PSYCHIATRIC:  Normal affect   ASSESSMENT:    1. Chest pain of uncertain etiology   2. Palpitations   3. Aortic valve insufficiency, etiology of cardiac valve disease unspecified   4. Mitral valve insufficiency, unspecified etiology   5. Mixed hyperlipidemia      PLAN:    Chest pain:  Atypical in description.  Coronary CTA on 01/03/2024 showed minimal CAD, calcium score 2 (30th percentile).  Echocardiogram 12/12/2023 showed EF 60 to 65%,  normal RV function, moderate mitral regurgitation, moderate tricuspid regurgitation.   Palpitations: Zio patch x 14 days and 11/2023 showed no significant arrhythmia.    HTN: Previously on enalapril but reports she stopped taking.  Appears normotensive off antihypertensives currently  Valvular heart disease: Moderate mitral regurgitation, moderate tricuspid regurgitation, and mild to moderate aortic regurgitation on echocardiogram 11/2023.  Plan repeat echocardiogram in 1 year to monitor  HLD: Statin Intolerance: LDL 181. Did not tolerate statins and reports she has an allergy to statin meds. Declined starting zetia.  LDL improved to 136 on 11/14/2023.  Referred to pharmacy lipid clinic given inability to tolerate statins, recommend starting red yeast rice    RTC in 1 year (reports she is leaving for [place] at  end of this month and will likely not return to [place] for about 1 year)  Medication Adjustments/Labs and Tests Ordered: Current medicines are reviewed at length with the patient today.  Concerns regarding medicines are outlined above.   No orders of the defined types were placed in this encounter.  No orders of the defined types were placed in this encounter.  Patient Instructions  Medication Instructions:  No Changes  Lab Work: None  Follow-Up: At [facility], you and your health needs are our priority.  As part of our continuing mission to provide you with exceptional heart care, we have created designated Provider Care Teams.  These Care Teams include your primary Cardiologist (physician) and Advanced Practice Providers (APPs -  Physician Assistants and Nurse Practitioners) who all work together to provide you with the care you need, when you need it.  Your next appointment:   1 year(s)  Provider:   Little Ishikawa, MD     Other Instructions Please call us or send a MyChart message if you need anything before you return in one year.   [phone]        Signed, Little Ishikawa, MD  02/07/2024 5:42 PM    [facility]

## 2024-02-07 ENCOUNTER — Encounter: Payer: Self-pay | Admitting: Cardiology

## 2024-02-07 ENCOUNTER — Ambulatory Visit: Payer: Medicaid Other | Attending: Cardiology | Admitting: Cardiology

## 2024-02-07 VITALS — BP 134/72 | HR 66 | Wt 135.6 lb

## 2024-02-07 DIAGNOSIS — E782 Mixed hyperlipidemia: Secondary | ICD-10-CM

## 2024-02-07 DIAGNOSIS — I340 Nonrheumatic mitral (valve) insufficiency: Secondary | ICD-10-CM

## 2024-02-07 DIAGNOSIS — I351 Nonrheumatic aortic (valve) insufficiency: Secondary | ICD-10-CM

## 2024-02-07 DIAGNOSIS — R002 Palpitations: Secondary | ICD-10-CM | POA: Diagnosis not present

## 2024-02-07 DIAGNOSIS — R079 Chest pain, unspecified: Secondary | ICD-10-CM | POA: Diagnosis not present

## 2024-02-07 NOTE — Patient Instructions (Signed)
 Medication Instructions:  No Changes  Lab Work: None  Follow-Up: At [facility], you and your health needs are our priority.  As part of our continuing mission to provide you with exceptional heart care, we have created designated Provider Care Teams.  These Care Teams include your primary Cardiologist (physician) and Advanced Practice Providers (APPs -  Physician Assistants and Nurse Practitioners) who all work together to provide you with the care you need, when you need it.  Your next appointment:   1 year(s)  Provider:   Little Ishikawa, MD     Other Instructions Please call us or send a MyChart message if you need anything before you return in one year.  [phone]

## 2024-02-21 ENCOUNTER — Ambulatory Visit: Payer: Medicaid Other | Admitting: Cardiology

## 2024-04-21 IMAGING — CT CT ABD-PELV W/ CM
2 of 5 series · 16 of 46 positions shown, 18 images · IV contrast (agent unspecified)
Comparison: None Available.

CLINICAL DATA: Unintentional weight loss generalized abdominal
pain. History of gastroesophageal reflux and regurgitation
esophagitis

EXAM:
CT ABDOMEN AND PELVIS WITH CONTRAST
TECHNIQUE: Multidetector CT imaging of the abdomen and pelvis was performed
using the standard protocol following bolus administration of
intravenous contrast.

[Series 2: axial st · axial · 0.98mm/px · z∈[-560,-235]mm · 13 of 77 slices shown, 15 images]
[im 6/77  soft-tissue]
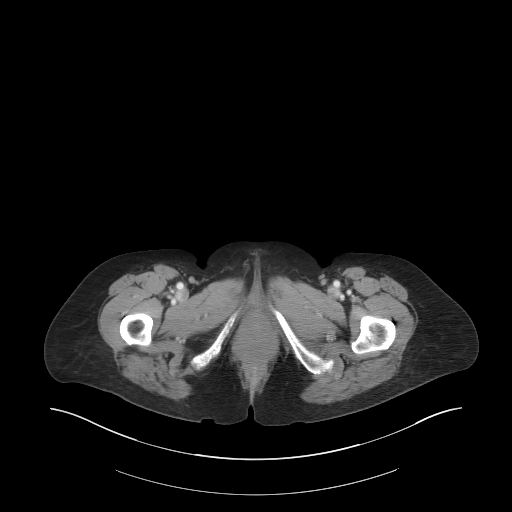
[im 6/77  bone]
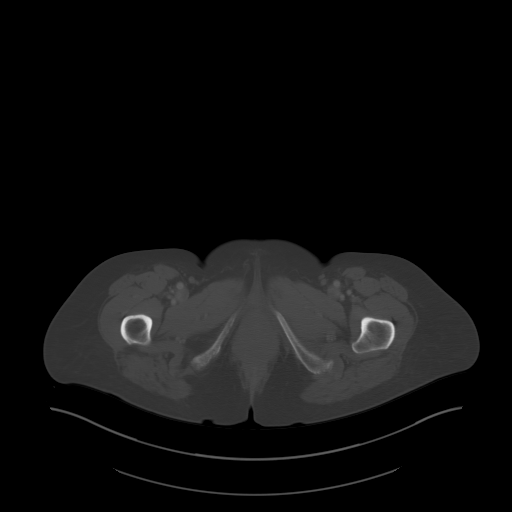
[im 11/77  soft-tissue]
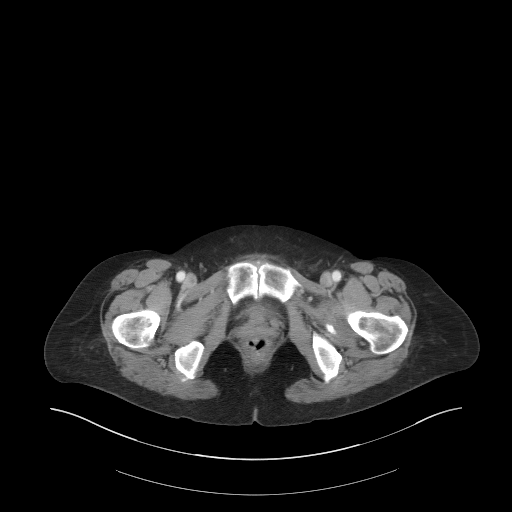
[im 16/77  soft-tissue]
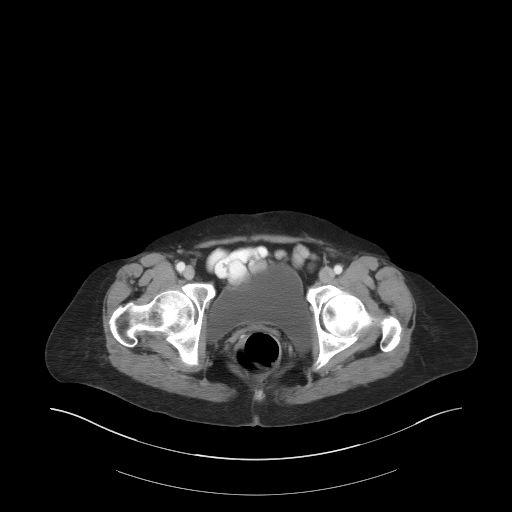
[im 21/77  soft-tissue]
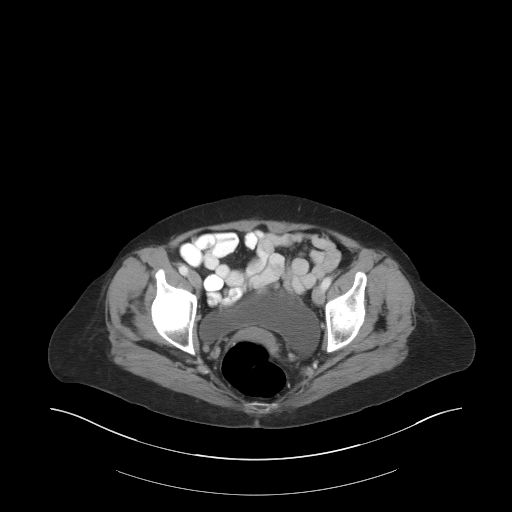
[im 26/77  soft-tissue]
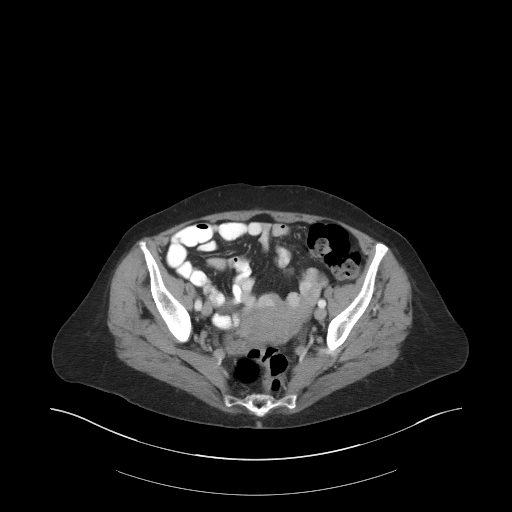
[im 31/77  soft-tissue]
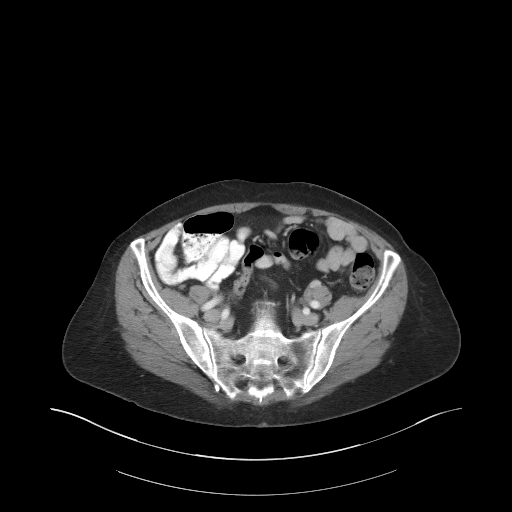
[im 41/77  soft-tissue]
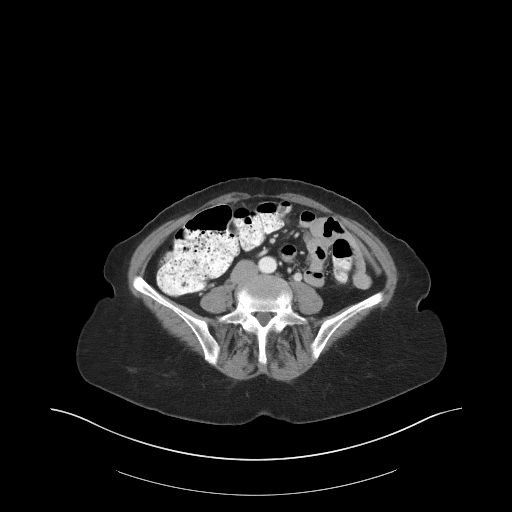
[im 46/77  soft-tissue]
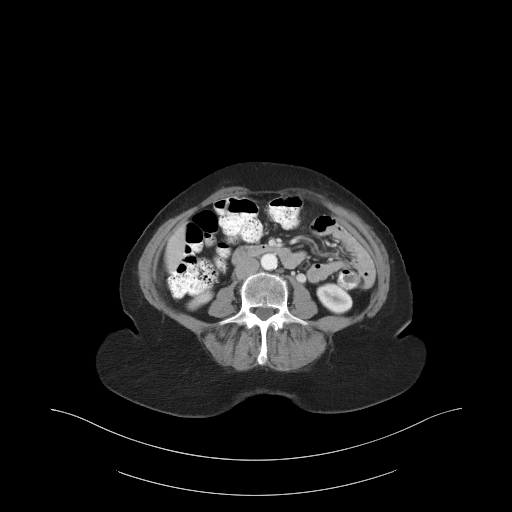
[im 51/77  soft-tissue]
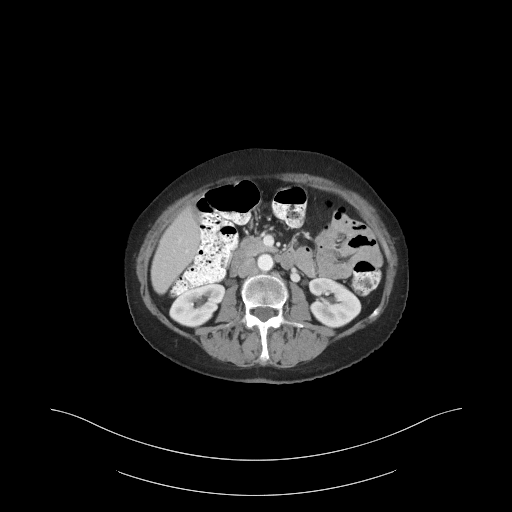
[im 51/77  bone]
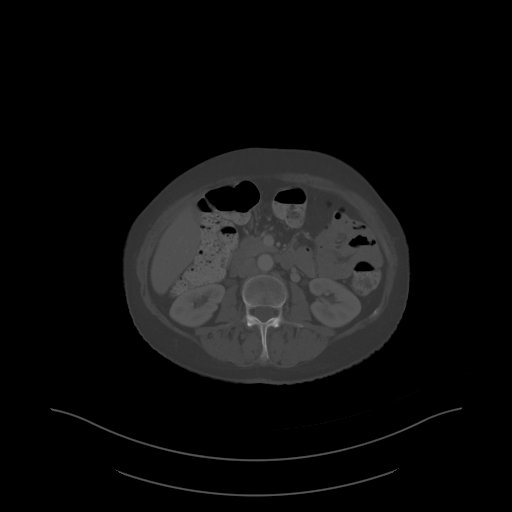
[im 56/77  soft-tissue]
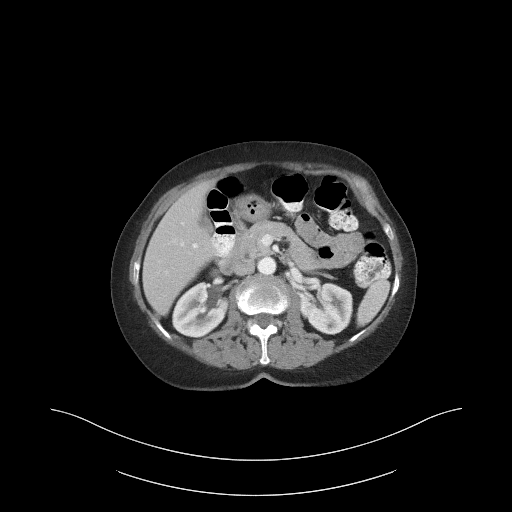
[im 61/77  soft-tissue]
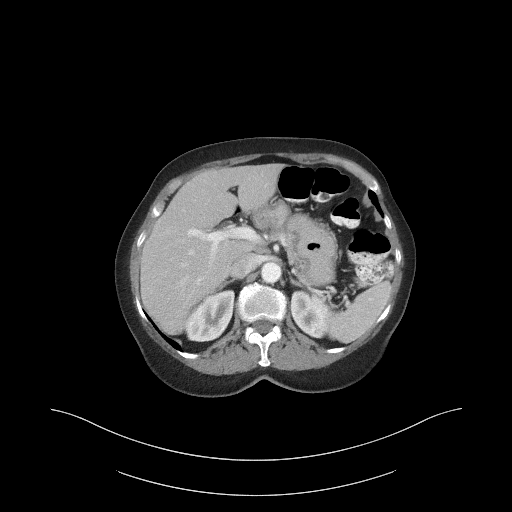
[im 66/77  soft-tissue]
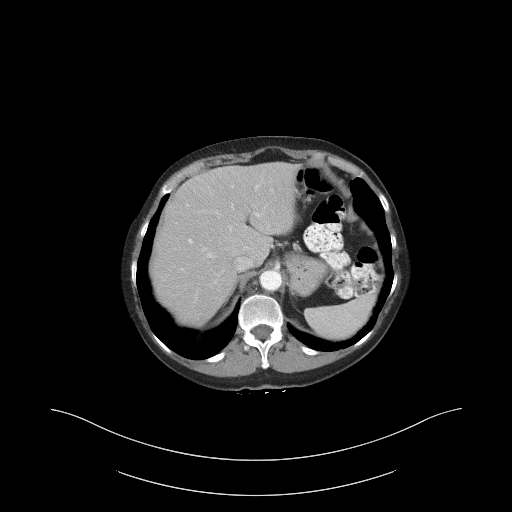
[im 71/77  soft-tissue]
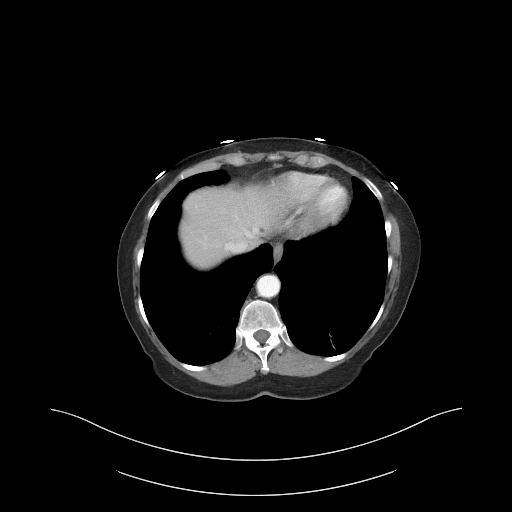

[Series 5: coronal st · coronal · 0.80mm/px · 3 of 112 slices shown]
[im 38/112  soft-tissue]
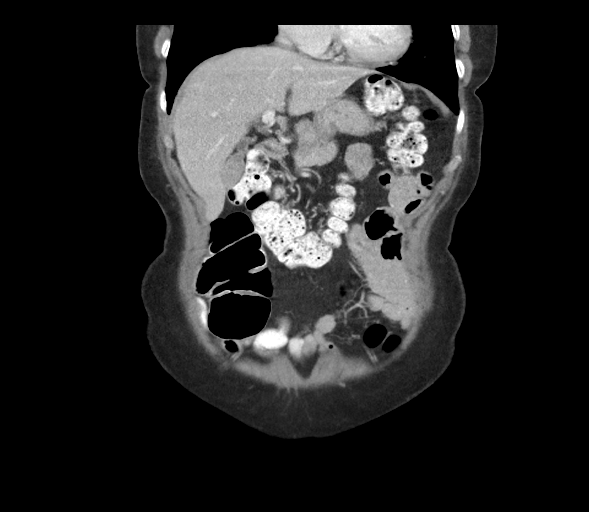
[im 50/112  soft-tissue]
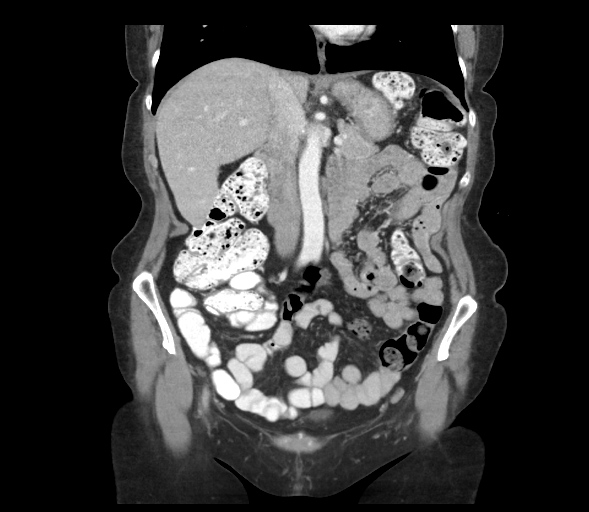
[im 62/112  soft-tissue]
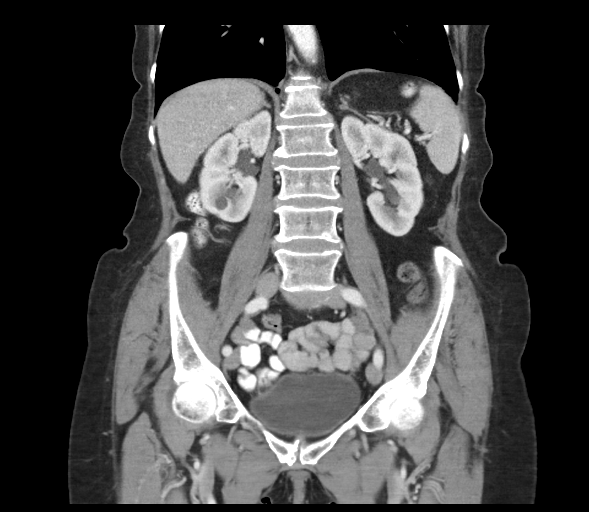

[16 of 46 positions shown; findings below may reference images not displayed]

RADIATION DOSE REDUCTION: This exam was performed according to the
departmental dose-optimization program which includes automated
exposure control, adjustment of the mA and/or kV according to
patient size and/or use of iterative reconstruction technique.

CONTRAST:  100mL OMNIPAQUE IOHEXOL 300 MG/ML  SOLN
FINDINGS: Lower chest: Bibasilar atelectasis/scarring.

Hepatobiliary: No suspicious hepatic lesion. Cholelithiasis without
findings of acute cholecystitis. No biliary ductal dilation.

Pancreas: No pancreatic ductal dilation or evidence of acute
inflammation.

Spleen: No splenomegaly or focal splenic lesion.

Adrenals/Urinary Tract: Bilateral adrenal glands appear normal.
Hydronephrosis. Hypodense bilateral renal lesions are technically
too small to accurately characterize but statistically likely
reflect cysts, which in the absence of clinically indicated
signs/symptoms require no independent follow-up. No suspicious renal
mass identified. Urinary bladder is unremarkable for degree of
distension.

Stomach/Bowel: Radiopaque enteric contrast material traverses the
descending colon. Stomach is nondistended limiting evaluation of the
wall. No pathologic dilation of small or large bowel. The appendix
and terminal ileum appear normal. Moderate volume of formed stool
throughout the colon. No evidence of acute bowel inflammation.

Vascular/Lymphatic: Aortic and branch vessel atherosclerosis without
abdominal aortic aneurysm. No pathologically enlarged abdominal or
pelvic lymph nodes.

Reproductive: Uterus and bilateral adnexa are unremarkable.

Other: No significant abdominopelvic free fluid.

Musculoskeletal: No acute osseous abnormality. Thoracolumbar
spondylosis.
IMPRESSION: 1. No acute abdominopelvic findings.
2. Cholelithiasis without findings of acute cholecystitis.
3. Moderate volume of formed stool throughout the colon. Correlate
for constipation.
4.  Aortic Atherosclerosis ([id number]).

## 2025-01-06 ENCOUNTER — Ambulatory Visit: Admitting: Gastroenterology
